# Patient Record
Sex: Female | Born: 1955
Health system: Southern US, Community
[De-identification: ages and names within clinical notes are randomized; demographics above are authoritative.]

## PROBLEM LIST (undated history)

## (undated) DIAGNOSIS — E785 Hyperlipidemia, unspecified: Secondary | ICD-10-CM

## (undated) DIAGNOSIS — M1990 Unspecified osteoarthritis, unspecified site: Secondary | ICD-10-CM

## (undated) DIAGNOSIS — M7989 Other specified soft tissue disorders: Secondary | ICD-10-CM

## (undated) HISTORY — PX: ABDOMINAL HYSTERECTOMY: SHX81

## (undated) HISTORY — DX: Unspecified osteoarthritis, unspecified site: M19.90

## (undated) HISTORY — DX: Other specified soft tissue disorders: M79.89

## (undated) HISTORY — DX: Hyperlipidemia, unspecified: E78.5

---

## 2014-07-11 ENCOUNTER — Other Ambulatory Visit: Payer: Self-pay | Admitting: Family Medicine

## 2014-07-11 DIAGNOSIS — Z1231 Encounter for screening mammogram for malignant neoplasm of breast: Secondary | ICD-10-CM

## 2014-08-04 ENCOUNTER — Ambulatory Visit: Payer: Self-pay

## 2014-08-14 ENCOUNTER — Ambulatory Visit
Admission: RE | Admit: 2014-08-14 | Discharge: 2014-08-14 | Disposition: A | Discharge status: Home / Self Care | Payer: BC Managed Care – PPO | Source: Ambulatory Visit | Attending: Family Medicine | Admitting: Family Medicine

## 2014-08-14 DIAGNOSIS — Z1231 Encounter for screening mammogram for malignant neoplasm of breast: Secondary | ICD-10-CM

## 2015-07-13 ENCOUNTER — Other Ambulatory Visit: Payer: Self-pay

## 2015-07-13 DIAGNOSIS — Z1231 Encounter for screening mammogram for malignant neoplasm of breast: Secondary | ICD-10-CM

## 2015-08-04 ENCOUNTER — Other Ambulatory Visit: Payer: Self-pay | Admitting: Family Medicine

## 2015-08-04 DIAGNOSIS — Z139 Encounter for screening, unspecified: Secondary | ICD-10-CM

## 2015-08-20 ENCOUNTER — Ambulatory Visit
Admission: RE | Admit: 2015-08-20 | Discharge: 2015-08-20 | Disposition: A | Discharge status: Home / Self Care | Payer: BC Managed Care – PPO | Source: Ambulatory Visit

## 2015-08-20 DIAGNOSIS — Z1231 Encounter for screening mammogram for malignant neoplasm of breast: Secondary | ICD-10-CM

## 2015-09-14 ENCOUNTER — Ambulatory Visit
Admission: RE | Admit: 2015-09-14 | Discharge: 2015-09-14 | Disposition: A | Discharge status: Home / Self Care | Payer: BC Managed Care – PPO | Source: Ambulatory Visit | Attending: Family Medicine | Admitting: Family Medicine

## 2015-09-14 DIAGNOSIS — Z139 Encounter for screening, unspecified: Secondary | ICD-10-CM

## 2015-11-24 ENCOUNTER — Other Ambulatory Visit: Payer: Self-pay | Admitting: Family Medicine

## 2015-11-24 DIAGNOSIS — IMO0002 Reserved for concepts with insufficient information to code with codable children: Secondary | ICD-10-CM

## 2015-11-24 DIAGNOSIS — R229 Localized swelling, mass and lump, unspecified: Principal | ICD-10-CM

## 2015-11-26 ENCOUNTER — Ambulatory Visit
Admission: RE | Admit: 2015-11-26 | Discharge: 2015-11-26 | Disposition: A | Discharge status: Home / Self Care | Payer: BC Managed Care – PPO | Source: Ambulatory Visit | Attending: Family Medicine | Admitting: Family Medicine

## 2015-11-26 ENCOUNTER — Other Ambulatory Visit: Payer: BC Managed Care – PPO

## 2015-11-26 DIAGNOSIS — R229 Localized swelling, mass and lump, unspecified: Principal | ICD-10-CM

## 2015-11-26 DIAGNOSIS — IMO0002 Reserved for concepts with insufficient information to code with codable children: Secondary | ICD-10-CM

## 2015-12-30 ENCOUNTER — Other Ambulatory Visit: Payer: Self-pay | Admitting: Family Medicine

## 2015-12-30 ENCOUNTER — Ambulatory Visit
Admission: RE | Admit: 2015-12-30 | Discharge: 2015-12-30 | Disposition: A | Discharge status: Home / Self Care | Payer: BC Managed Care – PPO | Source: Ambulatory Visit | Attending: Family Medicine | Admitting: Family Medicine

## 2015-12-30 DIAGNOSIS — R079 Chest pain, unspecified: Secondary | ICD-10-CM

## 2015-12-30 DIAGNOSIS — R0602 Shortness of breath: Secondary | ICD-10-CM

## 2016-01-06 ENCOUNTER — Other Ambulatory Visit: Payer: Self-pay | Admitting: Family Medicine

## 2016-01-06 DIAGNOSIS — R200 Anesthesia of skin: Secondary | ICD-10-CM

## 2016-01-06 DIAGNOSIS — R202 Paresthesia of skin: Principal | ICD-10-CM

## 2016-01-19 ENCOUNTER — Ambulatory Visit
Admission: RE | Admit: 2016-01-19 | Discharge: 2016-01-19 | Disposition: A | Discharge status: Home / Self Care | Payer: BC Managed Care – PPO | Source: Ambulatory Visit | Attending: Family Medicine | Admitting: Family Medicine

## 2016-01-19 DIAGNOSIS — R202 Paresthesia of skin: Principal | ICD-10-CM

## 2016-01-19 DIAGNOSIS — R200 Anesthesia of skin: Secondary | ICD-10-CM

## 2016-01-19 MED ORDER — GADOBENATE DIMEGLUMINE 529 MG/ML IV SOLN
15.0000 mL | Freq: Once | INTRAVENOUS | Status: AC | PRN
  Administered 2016-01-19: 15 mL via INTRAVENOUS

## 2016-07-04 ENCOUNTER — Other Ambulatory Visit: Payer: Self-pay | Admitting: Internal Medicine

## 2016-07-04 DIAGNOSIS — I82621 Acute embolism and thrombosis of deep veins of right upper extremity: Secondary | ICD-10-CM

## 2016-07-05 ENCOUNTER — Ambulatory Visit
Admission: RE | Admit: 2016-07-05 | Discharge: 2016-07-05 | Disposition: A | Discharge status: Home / Self Care | Payer: BC Managed Care – PPO | Source: Ambulatory Visit | Attending: Internal Medicine | Admitting: Internal Medicine

## 2016-07-05 DIAGNOSIS — I82621 Acute embolism and thrombosis of deep veins of right upper extremity: Secondary | ICD-10-CM

## 2016-07-06 ENCOUNTER — Other Ambulatory Visit: Payer: Self-pay | Admitting: Internal Medicine

## 2016-07-06 DIAGNOSIS — N6489 Other specified disorders of breast: Secondary | ICD-10-CM

## 2016-07-13 ENCOUNTER — Ambulatory Visit
Admission: RE | Admit: 2016-07-13 | Discharge: 2016-07-13 | Disposition: A | Discharge status: Home / Self Care | Payer: BC Managed Care – PPO | Source: Ambulatory Visit | Attending: Internal Medicine | Admitting: Internal Medicine

## 2016-07-13 DIAGNOSIS — N6489 Other specified disorders of breast: Secondary | ICD-10-CM

## 2016-07-15 ENCOUNTER — Other Ambulatory Visit: Payer: Self-pay | Admitting: Internal Medicine

## 2016-07-15 DIAGNOSIS — Z1231 Encounter for screening mammogram for malignant neoplasm of breast: Secondary | ICD-10-CM

## 2016-09-05 ENCOUNTER — Ambulatory Visit
Admission: RE | Admit: 2016-09-05 | Discharge: 2016-09-05 | Disposition: A | Discharge status: Home / Self Care | Payer: BC Managed Care – PPO | Source: Ambulatory Visit | Attending: Internal Medicine | Admitting: Internal Medicine

## 2016-09-05 DIAGNOSIS — Z1231 Encounter for screening mammogram for malignant neoplasm of breast: Secondary | ICD-10-CM

## 2017-03-20 ENCOUNTER — Other Ambulatory Visit: Payer: Self-pay | Admitting: Gastroenterology

## 2017-03-20 DIAGNOSIS — R1032 Left lower quadrant pain: Secondary | ICD-10-CM

## 2017-03-24 ENCOUNTER — Ambulatory Visit
Admission: RE | Admit: 2017-03-24 | Discharge: 2017-03-24 | Disposition: A | Discharge status: Home / Self Care | Payer: BC Managed Care – PPO | Source: Ambulatory Visit | Attending: Gastroenterology | Admitting: Gastroenterology

## 2017-03-24 DIAGNOSIS — R1032 Left lower quadrant pain: Secondary | ICD-10-CM

## 2017-03-24 MED ORDER — IOPAMIDOL (ISOVUE-300) INJECTION 61%
100.0000 mL | Freq: Once | INTRAVENOUS | Status: AC | PRN
  Administered 2017-03-24: 100 mL via INTRAVENOUS

## 2017-04-28 ENCOUNTER — Other Ambulatory Visit: Payer: Self-pay | Admitting: Obstetrics and Gynecology

## 2017-04-28 DIAGNOSIS — N63 Unspecified lump in unspecified breast: Secondary | ICD-10-CM

## 2017-05-02 ENCOUNTER — Other Ambulatory Visit: Payer: BC Managed Care – PPO

## 2017-05-12 ENCOUNTER — Ambulatory Visit
Admission: RE | Admit: 2017-05-12 | Discharge: 2017-05-12 | Disposition: A | Discharge status: Home / Self Care | Payer: BC Managed Care – PPO | Source: Ambulatory Visit | Attending: Obstetrics and Gynecology | Admitting: Obstetrics and Gynecology

## 2017-05-12 DIAGNOSIS — N63 Unspecified lump in unspecified breast: Secondary | ICD-10-CM

## 2017-07-31 ENCOUNTER — Other Ambulatory Visit: Payer: Self-pay | Admitting: Internal Medicine

## 2017-07-31 DIAGNOSIS — Z1239 Encounter for other screening for malignant neoplasm of breast: Secondary | ICD-10-CM

## 2017-09-18 ENCOUNTER — Ambulatory Visit
Admission: RE | Admit: 2017-09-18 | Discharge: 2017-09-18 | Disposition: A | Discharge status: Home / Self Care | Payer: BC Managed Care – PPO | Source: Ambulatory Visit | Attending: Internal Medicine | Admitting: Internal Medicine

## 2017-09-18 DIAGNOSIS — Z1239 Encounter for other screening for malignant neoplasm of breast: Secondary | ICD-10-CM

## 2018-02-01 ENCOUNTER — Other Ambulatory Visit: Payer: Self-pay

## 2018-02-01 DIAGNOSIS — M7989 Other specified soft tissue disorders: Secondary | ICD-10-CM

## 2018-03-16 ENCOUNTER — Ambulatory Visit (INDEPENDENT_AMBULATORY_CARE_PROVIDER_SITE_OTHER): Payer: BC Managed Care – PPO | Admitting: Vascular Surgery

## 2018-03-16 ENCOUNTER — Other Ambulatory Visit: Payer: Self-pay

## 2018-03-16 ENCOUNTER — Encounter: Payer: Self-pay | Admitting: Vascular Surgery

## 2018-03-16 ENCOUNTER — Ambulatory Visit (HOSPITAL_COMMUNITY)
Admission: RE | Admit: 2018-03-16 | Discharge: 2018-03-16 | Disposition: A | Discharge status: Home / Self Care | Payer: BC Managed Care – PPO | Source: Ambulatory Visit | Attending: Vascular Surgery | Admitting: Vascular Surgery

## 2018-03-16 VITALS — BP 126/84 | HR 73 | Temp 97.2°F | Resp 16 | Ht <= 58 in | Wt 163.0 lb

## 2018-03-16 DIAGNOSIS — M7989 Other specified soft tissue disorders: Secondary | ICD-10-CM

## 2018-03-16 NOTE — Progress Notes (Signed)
Patient ID: Lori Mata, female   DOB: 05-20-1956, 62 y.o.   MRN: [id number]  Reason for Consult: New Patient (Initial Visit) (swelling of right arm)   Referred by Zelphia Cairo, MD  Subjective:     HPI:  Lori Mata is a 62 y.o. female with out significant history.  She does have a recent history of swelling in her right upper extremity which is intermittent.  She states that the swelling is mostly mild but occasionally can be moderate.  She feels like her arm sits differently along the side of her chest and also her sleeves for differently.  She has never had a personal or family history of a blood clot.  She has been tested for breast cancer does not have any previous injury or right upper extremity chest or breast surgery.  She does not have any bulging veins.  She does not have any swelling in her face or neck.  She has no difficulty breathing when lying flat.  Currently her arm feels only very minimally swollen.  Past Medical History:  Diagnosis Date  . Swelling in right armpit    History reviewed. No pertinent family history. Past Surgical History:  Procedure Laterality Date  . ABDOMINAL HYSTERECTOMY      Short Social History:  Social History   Tobacco Use  . Smoking status: Never Smoker  . Smokeless tobacco: Never Used  Substance Use Topics  . Alcohol use: Yes    No Known Allergies  Current Outpatient Medications  Medication Sig Dispense Refill  . SPIRONOLACTONE PO Take by mouth.     No current facility-administered medications for this visit.     Review of Systems  Constitutional:  Constitutional negative. HENT: HENT negative.  Eyes: Eyes negative.  Respiratory: Respiratory negative.  Cardiovascular: Cardiovascular negative.  GI: Gastrointestinal negative.  Musculoskeletal:       Right arm edema Skin: Skin negative.  Neurological: Neurological negative. Hematologic: Hematologic/lymphatic negative.  Psychiatric: Psychiatric negative.        Objective:   Objective   Vitals:   03/16/18 0843  BP: 126/84  Pulse: 73  Resp: 16  Temp: (!) 97.2 F (36.2 C)  TempSrc: Oral  SpO2: 100%  Weight: 163 lb (73.9 kg)  Height: 4' 9.5" (1.461 m)   Body mass index is 34.66 kg/m.  Physical Exam  Constitutional: She is oriented to person, place, and time. She appears well-developed.  HENT:  Head: Normocephalic and atraumatic.  Eyes: Pupils are equal, round, and reactive to light.  Neck: Normal range of motion. Neck supple.  Cardiovascular: Normal rate.  Pulses:      Carotid pulses are 2+ on the right side, and 2+ on the left side.      Radial pulses are 2+ on the right side, and 2+ on the left side.  Pulmonary/Chest: Effort normal.  Abdominal: Soft.  Musculoskeletal:  Minimally larger rue than left  Lymphadenopathy:    She has no cervical adenopathy.  Neurological: She is alert and oriented to person, place, and time.  Skin: Skin is warm and dry.  Psychiatric: She has a normal mood and affect. Her behavior is normal. Judgment and thought content normal.    Data: I have independently interpreted her upper extremity venous study today which demonstrates no evidence of obstruction in her deep or superficial veins.     Assessment/Plan:     62 year old female presents with right upper extremity as well as chest and breast swelling that is intermittent in nature and usually  confined to mild and at worst moderate in nature.  There is no evidence of venous obstruction on our ultrasound today and then previous MRI did not demonstrate any nerve root compression to suggest any symptoms either.  I have offered her that we could pursue further studies with CT venogram or upper extremity venography or we could continue with expectant management.  At this time she wants no intervention and she can follow-up on a as needed basis.     Maeola HarmanBrandon Christopher Zylie Mumaw MD [facility]

## 2018-08-10 ENCOUNTER — Other Ambulatory Visit: Payer: Self-pay | Admitting: Internal Medicine

## 2018-08-10 DIAGNOSIS — Z1231 Encounter for screening mammogram for malignant neoplasm of breast: Secondary | ICD-10-CM

## 2018-09-25 ENCOUNTER — Ambulatory Visit
Admission: RE | Admit: 2018-09-25 | Discharge: 2018-09-25 | Disposition: A | Discharge status: Home / Self Care | Payer: BC Managed Care – PPO | Source: Ambulatory Visit | Attending: Internal Medicine | Admitting: Internal Medicine

## 2018-09-25 DIAGNOSIS — Z1231 Encounter for screening mammogram for malignant neoplasm of breast: Secondary | ICD-10-CM

## 2018-11-08 ENCOUNTER — Other Ambulatory Visit: Payer: Self-pay | Admitting: Radiology

## 2018-11-08 DIAGNOSIS — N644 Mastodynia: Secondary | ICD-10-CM

## 2018-11-09 ENCOUNTER — Other Ambulatory Visit: Payer: Self-pay | Admitting: Obstetrics and Gynecology

## 2018-11-09 DIAGNOSIS — N644 Mastodynia: Secondary | ICD-10-CM

## 2018-11-14 ENCOUNTER — Ambulatory Visit: Payer: BC Managed Care – PPO

## 2018-11-14 ENCOUNTER — Ambulatory Visit
Admission: RE | Admit: 2018-11-14 | Discharge: 2018-11-14 | Disposition: A | Discharge status: Home / Self Care | Payer: BC Managed Care – PPO | Source: Ambulatory Visit | Attending: Radiology | Admitting: Radiology

## 2018-11-14 ENCOUNTER — Other Ambulatory Visit: Payer: Self-pay | Admitting: Obstetrics and Gynecology

## 2018-11-14 ENCOUNTER — Ambulatory Visit
Admission: RE | Admit: 2018-11-14 | Discharge: 2018-11-14 | Disposition: A | Discharge status: Home / Self Care | Payer: BC Managed Care – PPO | Source: Ambulatory Visit | Attending: Obstetrics and Gynecology | Admitting: Obstetrics and Gynecology

## 2018-11-14 DIAGNOSIS — N644 Mastodynia: Secondary | ICD-10-CM

## 2019-08-15 ENCOUNTER — Other Ambulatory Visit: Payer: Self-pay | Admitting: Internal Medicine

## 2019-08-15 ENCOUNTER — Other Ambulatory Visit: Payer: Self-pay | Admitting: Obstetrics and Gynecology

## 2019-08-15 DIAGNOSIS — Z1231 Encounter for screening mammogram for malignant neoplasm of breast: Secondary | ICD-10-CM

## 2019-08-27 ENCOUNTER — Ambulatory Visit: Payer: Self-pay

## 2019-08-27 ENCOUNTER — Encounter: Payer: Self-pay | Admitting: Sports Medicine

## 2019-08-27 ENCOUNTER — Ambulatory Visit (INDEPENDENT_AMBULATORY_CARE_PROVIDER_SITE_OTHER): Payer: BC Managed Care – PPO | Admitting: Sports Medicine

## 2019-08-27 ENCOUNTER — Other Ambulatory Visit: Payer: Self-pay

## 2019-08-27 VITALS — BP 118/82 | Ht 67.0 in | Wt 157.0 lb

## 2019-08-27 DIAGNOSIS — S86002A Unspecified injury of left Achilles tendon, initial encounter: Secondary | ICD-10-CM

## 2019-08-27 NOTE — Progress Notes (Signed)
Subjective:    Patient ID: Lori Mata, female    DOB: [birth year], 63 y.o.   MRN: [id number]  HPI 63 year old female who presents with left posterior leg pain.  The pain started on 08/22/2019.  She states that she was in the middle of a 6 mile walk when the pain started.  It was of gradual onset and was a dull, aching pain in her posterior leg.  The patient was able to finish her walk, and did not think too much about it until she woke the next morning to a sharp aching sensation that had increased in intensity.  She took some Advil which helped her pain a little bit.  She stated that she had pain at both rest and with activity at that time.  She has been resting her leg as much as possible.  The pain has markedly improved since then but is still giving her some trouble when she walks or goes up and down stairs.  The patient never had this pain before.  She did not notice any trauma at the time of her pain starting.  She has noticed a "lump" and some swelling in her posterior Achilles tendon.  Of note the patient markedly increased her walking distance and duration approximately 2 months ago.  She had been walking 3 miles 5 times per week and is now walking 6 miles 7 times per week. She has not changed her shoes over this time course. She wears 0 drop shoes.   Review of Systems Per HPI    Objective:   Physical Exam General: Very pleasant 63 year old Caucasian female, no acute distress Respiratory: No distress, no sensory muscle use, able speak in clear coherent sentences Cardio: Skin warm and dry.  Left foot/ankle Inspection: No notable inversion or eversion with standing.  Negative "too many toes" sign.  Minimal swelling and mild fullness of left Achilles as compared to right Achilles area. Palpation: Tenderness to palpation left Achilles approximately 1 to 2 cm proximal to insertion site.  Increased tenderness to palpation anterior Achilles tendon.  Swelling noted by palpation left Achilles  tendon Range of motion: Fully intact, no limitations Strength: 5/5 strength to foot eversion, inversion.  5/5 strength to plantar and dorsiflexion of foot Stability: No signs of instability noted Neurovascular: Sensation fully intact, skin warm and dry. Special test: Negative Thompson test  Achilles ultrasound: Findings: Mild fluid accumulation along course of tendon corresponding to point of maximal pain observed in long axis.  Hypoechoic area noted within tendon on short axis, consistent with tendinopathy.  Patient with increased echogenicity of tendon sheath, and increased vascularity observed with Doppler.  Retrocalcaneal bursa visualized with no hypoechoic areas seen.  Achilles tendon attachment site with small osteophyte observed but otherwise no abnormal appearing structures findings.  Impression: Findings consistent with Achilles tendinopathy.  Small osteophyte noted at Achilles attachment site, unlikely to be clinically relevant.  Negative scan for retrocalcaneal bursitis.      Assessment & Plan:  Assessment 63 year old female who presents with approximately 5-day history of Achilles tendon pain.  Given her history, exam, and ultrasound findings patient likely has Achilles tendinopathy.  Retrocalcaneal bursitis considered but felt to be less likely given ultrasound findings.  Will treat with anti-inflammatories, eccentric soleus and gastrocnemius strengthening exercises, and gradual return to activity.  Also advised patient to go pick up different shoes with a heel lift.  Gave patient 5/16" heel lifts.  Patient can follow-up PRN if symptoms do not resolve or if they get  worse.  Plan - aleve 500mg  bid for 4 days - change walking shoes to shoes with better heel lift, add 5/16" heel pads - in 1-2 weeks start eccentric strengthening exercises for gastrocnemius and soleus - gradual return to activity, recommended starting at 3 mile walks and increasing by 10% each week - follow up prn   [name] MD PGY-3 Family Medicine Resident  Patient seen and evaluated with the resident.  I agree with the above plan of care.  Patient's ultrasound shows some inflammation around the Achilles tendon but dominant finding is Achilles tendinopathy.  Patient is clinically improving.  Treatment as above.  If symptoms persist consider topical nitroglycerin or formal physical therapy.  Follow-up for ongoing or recalcitrant issues.

## 2019-08-27 NOTE — Patient Instructions (Addendum)
It was great meeting you today! I am sorry that you have been having trouble with your achilles tendon. We performed an ultrasound and you have an achilles tendinopathy.  We will treat this in a couple of ways. You can take aleve 500mg  two times a day for the next 3-4 days.  We recommend getting a new shoe. Any normal walking shoe with the achilles elevated will work much better. We also gave you 5/16" heel lifts to further offload this area.   We gave you achilles eccentric muscle strengthening exercises. We discussed the ways that you can incorporate your piliates equipment into this. You can do these when the pain has improved a little bit.  Lastly you should gradually increase your walking. When you are feeling a little better you can start off with your old walking regimen (3 miles 5 times per week). You can gradually increase this by about 10% each week.

## 2019-09-26 ENCOUNTER — Ambulatory Visit (INDEPENDENT_AMBULATORY_CARE_PROVIDER_SITE_OTHER): Payer: BC Managed Care – PPO | Admitting: Sports Medicine

## 2019-09-26 ENCOUNTER — Other Ambulatory Visit: Payer: Self-pay

## 2019-09-26 VITALS — BP 110/70 | Ht 67.0 in | Wt 153.0 lb

## 2019-09-26 DIAGNOSIS — S86002D Unspecified injury of left Achilles tendon, subsequent encounter: Secondary | ICD-10-CM | POA: Insufficient documentation

## 2019-09-26 NOTE — Progress Notes (Signed)
phys

## 2019-09-26 NOTE — Assessment & Plan Note (Addendum)
History, exam, and [place] on 9/9 consistent with left achilles tendinopathy.  No improvement with home exercises.  Will refer for formal PT.  Advised to decrease activity to walking at 50% and increase gradually by 10% weekly if pain improving.  F/U in 6 weeks or sooner as needed.

## 2019-09-26 NOTE — Progress Notes (Signed)
Lori Mata is a 63 y.o. female who presents to [facility] today for the following:  F/U L achilles tendinopathy Seen 9/8 and had [place], diagnosed with left achilles tendinopathy Used ibuprofen for few days, but did not use longer as causes GI upset Did not have improvement with this Has been doing eccentric exercises since 5 days after last appointment religiously BID without improvement Notes that she continues to have discomfort in the area, but does not endorse "true pain" Walking 2, sometimes 3 miles, daily and has pain after this.  Will ice afterwards. Pain worse in AM and improves with normal movement throughout the day into the afternoon. Pain comes and goes and occasionally feels a burning Got new shoes, as she had been using 0 drop running shoes before, also using heel lifts and thinks that greatly helps her pain States that achilles has stayed swollen on the left    PMH reviewed.  ROS as above. Medications reviewed.  Exam:  BP 110/70   Ht 5\' 7"  (1.702 m)   Wt 153 lb (69.4 kg)   BMI 23.96 kg/m  Gen: Well NAD MSK:  Left Ankle: - Inspection: Fusiform swelling of left achilles with decreased calf size on left compared to right.  Left calf measures 35cm, right measures 36 cm.  No obvious deformity, erythema, or ecchymosis, ulcers, calluses, blisters - Palpation: No TTP at MT heads, no TTP at base of 5th MT, no TTP over cuboid, no tenderness over navicular prominence, no TTP over lateral or medial malleolus.  TTP medial achilles, most significant in mid tendon. - Strength: Normal strength with dorsiflexion, plantarflexion, inversion, and eversion of foot; flexion and extension of toes - ROM: Full ROM - Neuro/vasc: NV intact - Special Tests: Negative anterior drawer, negative talar tilt.    Feet: no deformity noted.  Normal arch w/I pes cavus or planus, normal arch flexibility.  Normal calcaneal motion with toe-raise. Does note mild discomfort with heel raise.    [place] Limited Joint  Space Structures Low Left  Result Date: 08/28/2019 Limited ultrasound of the left Achilles was performed.  Images in both long and short axis were obtained.  There is Achilles thickening with hypoechoic changes within the substance of the tendon.  Small amount of fluid along the posterior most fibers of the Achilles tendon as well.  No signs of tendon rupture.  No signs of retrocalcaneal bursitis.  Findings are consistent with Achilles tendinopathy with mild inflammation.    Assessment and Plan: 1) Achilles tendon injury, left, subsequent encounter History, exam, and [place] on 9/9 consistent with left achilles tendinopathy.  No improvement with home exercises.  Will refer for formal PT.  Advised to decrease activity to walking at 50% and increase gradually by 10% weekly if pain improving.  F/U in 6 weeks or sooner as needed.   [name], D.O.  PGY-2 Family Medicine  09/26/2019 11:07 AM   Patient seen and evaluated with the resident.  I agree with the above plan of care.  I have asked the patient to decrease her recreational walking by 50 %/week and increase 10 %/week thereafter based on symptoms.  I would also like for her to start formal physical therapy and follow-up with me again in 6 weeks.  Call with questions or concerns in the interim.

## 2019-09-30 ENCOUNTER — Ambulatory Visit
Admission: RE | Admit: 2019-09-30 | Discharge: 2019-09-30 | Disposition: A | Discharge status: Home / Self Care | Payer: BC Managed Care – PPO | Source: Ambulatory Visit | Attending: Obstetrics and Gynecology | Admitting: Obstetrics and Gynecology

## 2019-09-30 ENCOUNTER — Other Ambulatory Visit: Payer: Self-pay

## 2019-09-30 DIAGNOSIS — Z1231 Encounter for screening mammogram for malignant neoplasm of breast: Secondary | ICD-10-CM

## 2019-10-02 ENCOUNTER — Ambulatory Visit: Payer: BC Managed Care – PPO | Attending: Sports Medicine | Admitting: Physical Therapy

## 2019-10-02 ENCOUNTER — Other Ambulatory Visit: Payer: Self-pay

## 2019-10-02 DIAGNOSIS — M25572 Pain in left ankle and joints of left foot: Secondary | ICD-10-CM | POA: Diagnosis present

## 2019-10-04 ENCOUNTER — Encounter: Payer: Self-pay | Admitting: Physical Therapy

## 2019-10-04 NOTE — Therapy (Signed)
[place], [place], [address] Phone: [phone]   Fax:  [phone]  Physical Therapy Evaluation  Patient Details  Name: Lori Mata MRN: [id number] Date of Birth: [birth year] Referring Provider (PT): Lilia Argue   Encounter Date: 10/02/2019  PT End of Session - 10/04/19 1504    Visit Number  1    Number of Visits  12    Date for PT Re-Evaluation  11/13/19    Authorization Type  [employer]    PT Start Time  0254    PT Stop Time  1615    PT Time Calculation (min)  37 min    Activity Tolerance  Patient tolerated treatment well    Behavior During Therapy  [facility] for tasks assessed/performed       Past Medical History:  Diagnosis Date  . Swelling in right armpit     Past Surgical History:  Procedure Laterality Date  . ABDOMINAL HYSTERECTOMY      There were no vitals filed for this visit.   Subjective Assessment - 10/04/19 1445    Subjective  Pt states increased pain in L achilles/heel. She was walking on average 2-3 miles/day but then increased to up to 6-8 miles/day, and had switches to zero drop shoe at the time. She has not had foot/ankle pain in the past. . She is working from home, professor, Veterinary surgeon. She is now wearing Adrenaline shoe with superfeet inserts, will bring to next visit. Pt states mild improvement in pain since wearing shoes, but is still sore. Eager to get back to more walking.    Limitations  Standing;Walking;House hold activities    Patient Stated Goals  Decreased pain, increased walking milage    Currently in Pain?  Yes    Pain Score  5     Pain Location  Ankle    Pain Orientation  Left    Pain Descriptors / Indicators  Aching    Pain Type  Acute pain    Pain Onset  1 to 4 weeks ago    Pain Frequency  Intermittent    Aggravating Factors   standing, walking, in AM    Pain Relieving Factors  rest,         [facility] PT Assessment - 10/04/19 0001      Assessment   Medical  Diagnosis  L Achilles Injury    Referring Provider (PT)  Lilia Argue    Prior Therapy  no      Balance Screen   Has the patient fallen in the past 6 months  No      Prior Function   Level of Independence  Independent      Cognition   Overall Cognitive Status  Within Functional Limits for tasks assessed      Posture/Postural Control   Posture Comments  Standing: Neutral foot posture bilaterally       ROM / Strength   AROM / PROM / Strength  AROM;Strength      AROM   Overall AROM Comments  L ankle PF: WFL, DF: 3, INV/EV: WFL;       Strength   Overall Strength Comments  Ankle PF/DF: 4/5;  Inv/Ev: 4/5 ;  Hips: 4+/5, Knee: 4+/5       Palpation   Palpation comment  Hypomobile STJ, Pain at distal achilles, Thickening of tendon distally, Trigger points noted in gastroc      Balance   Balance Assessed  --   Decreased SLS time on  L vs R                Objective measurements completed on examination: See above findings.      [facility] Adult PT Treatment/Exercise - 10/04/19 0001      Exercises   Exercises  Ankle      Modalities   Modalities  Iontophoresis      Iontophoresis   Type of Iontophoresis  Dexamethasone    Location  L distal achilles    Time  6 hr patch      Manual Therapy   Manual Therapy  Joint mobilization;Passive ROM;Soft tissue mobilization    Joint Mobilization  To inc DF    Soft tissue mobilization  DTM and IASTM to achilles.     Passive ROM  to inc DF       Ankle Exercises: Stretches   Other Stretch  Prone: AROM for DF/PF              PT Education - 10/04/19 1504    Education Details  PT POC, HEP, footwear    Person(s) Educated  Patient    Methods  Explanation;Demonstration;Handout    Comprehension  Verbalized understanding;Returned demonstration;Need further instruction       PT Short Term Goals - 10/04/19 1507      PT SHORT TERM GOAL #1   Title  Pt to be independent wtih initial HEP    Time  2    Period  Weeks    Status   New    Target Date  10/16/19      PT SHORT TERM GOAL #2   Title  Pt to report decreased pain in L foot to be 3/10 with activity    Time  2    Status  New    Target Date  10/16/19        PT Long Term Goals - 10/04/19 1508      PT LONG TERM GOAL #1   Title  Pt to be compliant with footwear appropriate for her foot type and Dx.    Time  6    Period  Weeks    Status  New    Target Date  11/13/19      PT LONG TERM GOAL #2   Title  Pt to report decreased pain in L foot, to 0-2/10 with walking up to 3 miles    Time  6    Period  Weeks    Status  New    Target Date  11/13/19      PT LONG TERM GOAL #3   Title  Pt to be independent with final HEP    Time  6    Period  Weeks    Status  New    Target Date  11/13/19             Plan - 10/04/19 1512    Clinical Impression Statement  Pt presents wtih primary complaint of increased pain in L distal achilles. She has soreness at mid and R achilles, at insertion, with thickening of tendon compared to R side. Pt with stiffness of STJ, with mild decrease in DF ROM. Pt with decreased ability for full DF at terminal stance of gait. Pt with likely irritation from increased walking milage in low heel shoes.Discussed footwear with pt today, she will bring shoes in next visit to assess. Pt with decrased ability for full funcitonal activities andcommunity activites, due to pain with weight bearing and walking. Pt to  benefit from skilled PT to improve.    Examination-Activity Limitations  Locomotion Level;Stand;Stairs    Examination-Participation Restrictions  Other;Community Activity    Stability/Clinical Decision Making  Stable/Uncomplicated    Clinical Decision Making  Low    Rehab Potential  Good    PT Frequency  2x / week    PT Duration  6 weeks    PT Treatment/Interventions  ADLs/Self Care Home Management;Cryotherapy;Electrical Stimulation;DME Instruction;Ultrasound;Moist Heat;Iontophoresis 4mg /ml Dexamethasone;Gait training;Stair  training;Functional mobility training;Therapeutic activities;Therapeutic exercise;Balance training;Orthotic Fit/Training;Patient/family education;Neuromuscular re-education;Manual techniques;Taping;Dry needling;Passive range of motion;Spinal Manipulations;Joint Manipulations    PT Next Visit Plan  Review DF mob and stretch    PT Home Exercise Plan  [employer]    Consulted and Agree with Plan of Care  Patient       Patient will benefit from skilled therapeutic intervention in order to improve the following deficits and impairments:  Abnormal gait, Increased fascial restricitons, Pain, Increased muscle spasms, Decreased mobility, Decreased activity tolerance, Decreased range of motion, Decreased strength, Difficulty walking  Visit Diagnosis: Pain in left ankle and joints of left foot     Problem List Patient Active Problem List   Diagnosis Date Noted  . Achilles tendon injury, left, subsequent encounter 09/26/2019    Sedalia MutaLauren Yohan Samons, PT, DPT 3:25 PM  10/04/19    [facility] [address] [place], [place], [address] Phone: [phone]   Fax:  [phone]  Name: Lucile CraterDana Guadamuz MRN: [id number] Date of Birth: 12/13/1956

## 2019-10-04 NOTE — Patient Instructions (Signed)
Continue with [employer] she is currently doing, 3x15, 2-3 x/day

## 2019-10-16 ENCOUNTER — Other Ambulatory Visit: Payer: Self-pay

## 2019-10-16 ENCOUNTER — Ambulatory Visit: Payer: BC Managed Care – PPO | Admitting: Physical Therapy

## 2019-10-16 ENCOUNTER — Encounter: Payer: Self-pay | Admitting: Physical Therapy

## 2019-10-16 DIAGNOSIS — M25572 Pain in left ankle and joints of left foot: Secondary | ICD-10-CM | POA: Diagnosis not present

## 2019-10-16 NOTE — Therapy (Signed)
[place], [place], [address] Phone: [phone]   Fax:  [phone]  Physical Therapy Treatment  Patient Details  Name: Lori Mata MRN: [id number] Date of Birth: 1956-11-22 Referring Provider (PT): Lilia Argue   Encounter Date: 10/16/2019  PT End of Session - 10/16/19 1514    Visit Number  2    Number of Visits  12    Date for PT Re-Evaluation  11/13/19    Authorization Type  BCBS    PT Start Time  1315    PT Stop Time  1354    PT Time Calculation (min)  39 min    Activity Tolerance  Patient tolerated treatment well    Behavior During Therapy  [facility] for tasks assessed/performed       Past Medical History:  Diagnosis Date  . Swelling in right armpit     Past Surgical History:  Procedure Laterality Date  . ABDOMINAL HYSTERECTOMY      There were no vitals filed for this visit.  Subjective Assessment - 10/16/19 1414    Subjective  Pt states slight improvements. Brought sneakers to look at today.    Currently in Pain?  Yes    Pain Score  5     Pain Location  Ankle    Pain Orientation  Left    Pain Descriptors / Indicators  Aching    Pain Type  Acute pain    Pain Onset  1 to 4 weeks ago    Pain Frequency  Intermittent                       [facility] Adult PT Treatment/Exercise - 10/16/19 1408      Posture/Postural Control   Posture Comments  Standing: Neutral foot posture bilaterally       Exercises   Exercises  Ankle      Modalities   Modalities  Iontophoresis      Iontophoresis   Type of Iontophoresis  Dexamethasone    Location  L distal achilles    Time  6 hr patch      Manual Therapy   Manual Therapy  Joint mobilization;Passive ROM;Soft tissue mobilization    Joint Mobilization  To inc DF and EV    Soft tissue mobilization  DTM and IASTM to gastroc and achilles.     Passive ROM  to inc DF       Ankle Exercises: Stretches   Other Stretch  Prone: AROM for DF/PF      Other Stretch  Supine HSS with strap and ankle pump 2x10;  Kneeling DF glide x20 ;       Ankle Exercises: Aerobic   Recumbent Bike  L4 x8 min;       Ankle Exercises: Standing   Heel Raises Limitations  x30 eccentric, with ball at ankles       Ankle Exercises: Supine   Other Supine Ankle Exercises  Inv/Ev ROM x20;              PT Education - 10/16/19 1403    Education Details  Education on HEP    Person(s) Educated  Patient    Methods  Explanation;Demonstration;Verbal cues    Comprehension  Verbalized understanding;Returned demonstration;Verbal cues required;Need further instruction       PT Short Term Goals - 10/04/19 1507      PT SHORT TERM GOAL #1   Title  Pt to be independent wtih initial HEP  Time  2    Period  Weeks    Status  New    Target Date  10/16/19      PT SHORT TERM GOAL #2   Title  Pt to report decreased pain in L foot to be 3/10 with activity    Time  2    Status  New    Target Date  10/16/19        PT Long Term Goals - 10/04/19 1508      PT LONG TERM GOAL #1   Title  Pt to be compliant with footwear appropriate for her foot type and Dx.    Time  6    Period  Weeks    Status  New    Target Date  11/13/19      PT LONG TERM GOAL #2   Title  Pt to report decreased pain in L foot, to 0-2/10 with walking up to 3 miles    Time  6    Period  Weeks    Status  New    Target Date  11/13/19      PT LONG TERM GOAL #3   Title  Pt to be independent with final HEP    Time  6    Period  Weeks    Status  New    Target Date  11/13/19            Plan - 10/16/19 1518    Clinical Impression Statement  Pt with soreness at medial distal achilles. Manual done today to improve DF ROM and tightness in gastroc. Ionto done for pain and inflamation. Pt instructed on HEP, handout given today. Discussed footwear today. Pts current shoes with orthotics appear to be over correcting and causing mild supination. Better with removal of orthotics. Pt will  keep current shoes for now, but will try on neutral shoe next time for optimal alignment.    Examination-Activity Limitations  Locomotion Level;Stand;Stairs    Examination-Participation Restrictions  Other;Community Activity    Stability/Clinical Decision Making  Stable/Uncomplicated    Rehab Potential  Good    PT Frequency  2x / week    PT Duration  6 weeks    PT Treatment/Interventions  ADLs/Self Care Home Management;Cryotherapy;Electrical Stimulation;DME Instruction;Ultrasound;Moist Heat;Iontophoresis 4mg /ml Dexamethasone;Gait training;Stair training;Functional mobility training;Therapeutic activities;Therapeutic exercise;Balance training;Orthotic Fit/Training;Patient/family education;Neuromuscular re-education;Manual techniques;Taping;Dry needling;Passive range of motion;Spinal Manipulations;Joint Manipulations    PT Next Visit Plan  Review DF mob and stretch    PT Home Exercise Plan  [employer]    Consulted and Agree with Plan of Care  Patient       Patient will benefit from skilled therapeutic intervention in order to improve the following deficits and impairments:  Abnormal gait, Increased fascial restricitons, Pain, Increased muscle spasms, Decreased mobility, Decreased activity tolerance, Decreased range of motion, Decreased strength, Difficulty walking  Visit Diagnosis: Pain in left ankle and joints of left foot     Problem List Patient Active Problem List   Diagnosis Date Noted  . Achilles tendon injury, left, subsequent encounter 09/26/2019   11/26/2019, PT, DPT 3:22 PM  10/16/19    [facility] [address] [place], [place], [place] Phone: [phone]   Fax:  [phone]  Name: Lori Mata MRN: [name] Date of Birth: [birth year]

## 2019-10-16 NOTE — Patient Instructions (Signed)
Access Code: [email or web address]  URL: [email or web address]  Date: 10/16/2019  Prepared by: Lyndee Hensen   Exercises Standing Heel Raise - 15 reps - 2 sets - 2-3x daily Half Kneel Ankle Dorsiflexion Self-Mobilization - 10 reps - 2 sets - 2x daily Supine Hamstring Stretch with Strap - 10 reps - 2 sets - 2x daily

## 2019-10-18 ENCOUNTER — Ambulatory Visit: Payer: BC Managed Care – PPO | Admitting: Physical Therapy

## 2019-10-23 ENCOUNTER — Encounter: Payer: Self-pay | Admitting: Physical Therapy

## 2019-10-23 ENCOUNTER — Ambulatory Visit: Payer: BC Managed Care – PPO | Attending: Sports Medicine | Admitting: Physical Therapy

## 2019-10-23 ENCOUNTER — Other Ambulatory Visit: Payer: Self-pay

## 2019-10-23 DIAGNOSIS — M25572 Pain in left ankle and joints of left foot: Secondary | ICD-10-CM | POA: Diagnosis not present

## 2019-10-23 NOTE — Therapy (Signed)
[facility] [address] [place], [place], [address] Phone: [phone]   Fax:  [phone]  Physical Therapy Treatment  Patient Details  Name: Lori Mata MRN: [id number] Date of Birth: [birth year] Referring Provider (PT): Reino Bellis   Encounter Date: 10/23/2019  PT End of Session - 10/23/19 1058    Visit Number  3    Number of Visits  12    Date for PT Re-Evaluation  11/13/19    Authorization Type  BCBS    PT Start Time  1002    PT Stop Time  1044    PT Time Calculation (min)  42 min    Activity Tolerance  Patient tolerated treatment well    Behavior During Therapy  [facility] for tasks assessed/performed       Past Medical History:  Diagnosis Date  . Swelling in right armpit     Past Surgical History:  Procedure Laterality Date  . ABDOMINAL HYSTERECTOMY      There were no vitals filed for this visit.  Subjective Assessment - 10/23/19 1056    Subjective  Pt states mild improvments, thinks pain in AM is better. She had to cancel last appt, tree fell on house, and is having to move temporarily, does not have her sneakers bc they are in debris at house.    Currently in Pain?  Yes    Pain Score  3     Pain Location  Ankle    Pain Orientation  Left    Pain Descriptors / Indicators  Aching    Pain Type  Acute pain    Pain Onset  1 to 4 weeks ago    Pain Frequency  Intermittent    Aggravating Factors   teminal stance.                       [facility] Adult PT Treatment/Exercise - 10/23/19 1001      Posture/Postural Control   Posture Comments  Standing: Neutral foot posture bilaterally       Exercises   Exercises  Ankle      Modalities   Modalities  Iontophoresis      Iontophoresis   Type of Iontophoresis  Dexamethasone    Location  L distal achilles    Time  6 hr patch      Manual Therapy   Manual Therapy  Joint mobilization;Passive ROM;Soft tissue mobilization    Joint Mobilization  To inc DF and  EV    Soft tissue mobilization  DTM and IASTM to gastroc and achilles.     Passive ROM  to inc DF       Ankle Exercises: Aerobic   Recumbent Bike  L4 x8 min;       Ankle Exercises: Stretches   Other Stretch  Prone: AROM for DF/PF     Other Stretch  Supine HSS with strap and ankle pump 2x10;  Kneeling DF glide x20 ;       Ankle Exercises: Standing   SLS  30 sec x3 bil;     Heel Raises Limitations  x30 eccentric, with ball at ankles     Other Standing Ankle Exercises  Staggered stance weight shift L R foot in front, for terminal stance push off.       Ankle Exercises: Supine   Other Supine Ankle Exercises  --               PT Short Term Goals - 10/04/19  [place] #1   Title  Pt to be independent wtih initial HEP    Time  2    Period  Weeks    Status  New    Target Date  10/16/19      PT SHORT TERM GOAL #2   Title  Pt to report decreased pain in L foot to be 3/10 with activity    Time  2    Status  New    Target Date  10/16/19        PT Long Term Goals - 10/04/19 1508      PT LONG TERM GOAL #1   Title  Pt to be compliant with footwear appropriate for her foot type and Dx.    Time  6    Period  Weeks    Status  New    Target Date  11/13/19      PT LONG TERM GOAL #2   Title  Pt to report decreased pain in L foot, to 0-2/10 with walking up to 3 miles    Time  6    Period  Weeks    Status  New    Target Date  11/13/19      PT LONG TERM GOAL #3   Title  Pt to be independent with final HEP    Time  6    Period  Weeks    Status  New    Target Date  11/13/19            Plan - 10/23/19 1101    Clinical Impression Statement  Pt with improved pain with palpation of medial achilles today. Still has mild pain at terminal stance, but overall improving gait pattern, with improved heel to toe propulsion. Pt to benefit from continued care.    Examination-Activity Limitations  Locomotion Level;Stand;Stairs    Examination-Participation  Restrictions  Other;Community Activity    Stability/Clinical Decision Making  Stable/Uncomplicated    Rehab Potential  Good    PT Frequency  2x / week    PT Duration  6 weeks    PT Treatment/Interventions  ADLs/Self Care Home Management;Cryotherapy;Electrical Stimulation;DME Instruction;Ultrasound;Moist Heat;Iontophoresis 4mg /ml Dexamethasone;Gait training;Stair training;Functional mobility training;Therapeutic activities;Therapeutic exercise;Balance training;Orthotic Fit/Training;Patient/family education;Neuromuscular re-education;Manual techniques;Taping;Dry needling;Passive range of motion;Spinal Manipulations;Joint Manipulations    PT Next Visit Plan  Review DF mob and stretch    PT Home Exercise Plan  [employer]    Consulted and Agree with Plan of Care  Patient       Patient will benefit from skilled therapeutic intervention in order to improve the following deficits and impairments:  Abnormal gait, Increased fascial restricitons, Pain, Increased muscle spasms, Decreased mobility, Decreased activity tolerance, Decreased range of motion, Decreased strength, Difficulty walking  Visit Diagnosis: Pain in left ankle and joints of left foot     Problem List Patient Active Problem List   Diagnosis Date Noted  . Achilles tendon injury, left, subsequent encounter 09/26/2019    Lyndee Hensen, PT, DPT 11:06 AM  10/23/19    [facility] [address] [place], [place], [address] Phone: [phone]   Fax:  [phone]  Name: Lori Mata MRN: [id number] Date of Birth: 1955/12/26

## 2019-10-24 ENCOUNTER — Ambulatory Visit: Payer: BC Managed Care – PPO | Admitting: Physical Therapy

## 2019-10-30 ENCOUNTER — Ambulatory Visit: Payer: BC Managed Care – PPO | Admitting: Physical Therapy

## 2019-11-01 ENCOUNTER — Other Ambulatory Visit: Payer: Self-pay

## 2019-11-01 ENCOUNTER — Ambulatory Visit: Payer: BC Managed Care – PPO | Admitting: Physical Therapy

## 2019-11-01 ENCOUNTER — Encounter: Payer: Self-pay | Admitting: Physical Therapy

## 2019-11-01 DIAGNOSIS — M25572 Pain in left ankle and joints of left foot: Secondary | ICD-10-CM | POA: Diagnosis not present

## 2019-11-01 NOTE — Therapy (Signed)
[facility] [address] [place], [place], [address] Phone: [phone]   Fax:  [phone]  Physical Therapy Treatment  Patient Details  Name: Lori Mata MRN: [id number] Date of Birth: Jan 26, 1956 Referring Provider (PT): Reino Bellis   Encounter Date: 11/01/2019  PT End of Session - 11/01/19 1006    Visit Number  4    Number of Visits  12    Date for PT Re-Evaluation  11/13/19    Authorization Type  BCBS    PT Start Time  1002    PT Stop Time  1041    PT Time Calculation (min)  39 min    Activity Tolerance  Patient tolerated treatment well    Behavior During Therapy  [facility] for tasks assessed/performed       Past Medical History:  Diagnosis Date  . Swelling in right armpit     Past Surgical History:  Procedure Laterality Date  . ABDOMINAL HYSTERECTOMY      There were no vitals filed for this visit.  Subjective Assessment - 11/01/19 1007    Subjective  " I can tell I am getting better. When I am walking its low,    Patient Stated Goals  Decreased pain, increased walking milage    Currently in Pain?  No/denies    Pain Score  0-No pain         [facility] PT Assessment - 11/01/19 0001      Assessment   Medical Diagnosis  L Achilles Injury    Referring Provider (PT)  Reino Bellis                   [facility] Adult PT Treatment/Exercise - 11/01/19 0001      Manual Therapy   Manual Therapy  Taping    Manual therapy comments  MTPR along the gastroc/ solues both medial/ lateral x 2 ea    Joint Mobilization  PA grade III to promote DF    Soft tissue mobilization  IASTM along gastroc/soleus and over medial/ lateral aspects of achilles tendon. active release techniques of the gastroc/soleus    Kinesiotex  Inhibit Muscle      Kinesiotix   Inhibit Muscle   gastroc/ soleus on the L      Ankle Exercises: Standing   Heel Raises Limitations  2 x 15 off edge of ste5   1 set con bil/ecc LLE   Other Standing Ankle  Exercises  self ankle mob with theraband using green band to promote DF             PT Education - 11/01/19 1244    Education Details  reviewed HEP and how to perform heel raise on edge of step with focus on eccentric lowering, using a band to perform self ankle DF mobs with forward rocking with the L foot forward    Person(s) Educated  Patient    Methods  Explanation;Verbal cues    Comprehension  Verbalized understanding;Verbal cues required       PT Short Term Goals - 10/04/19 1507      PT SHORT TERM GOAL #1   Title  Pt to be independent wtih initial HEP    Time  2    Period  Weeks    Status  New    Target Date  10/16/19      PT SHORT TERM GOAL #2   Title  Pt to report decreased pain in L foot to be 3/10 with activity  Time  2    Status  New    Target Date  10/16/19        PT Long Term Goals - 10/04/19 1508      PT LONG TERM GOAL #1   Title  Pt to be compliant with footwear appropriate for her foot type and Dx.    Time  6    Period  Weeks    Status  New    Target Date  11/13/19      PT LONG TERM GOAL #2   Title  Pt to report decreased pain in L foot, to 0-2/10 with walking up to 3 miles    Time  6    Period  Weeks    Status  New    Target Date  11/13/19      PT LONG TERM GOAL #3   Title  Pt to be independent with final HEP    Time  6    Period  Weeks    Status  New    Target Date  11/13/19            Plan - 11/01/19 1242    Clinical Impression Statement  pt continues to report mild pain along the medial aspect of the achilles. conintued STW and IASTM along the gastroc/ solues and achilles tendon. worked on self mobs using band in standing and heel raises with focus on eccentrics. trialed KT tape to promote gastroc/soleus inhibition.    PT Treatment/Interventions  ADLs/Self Care Home Management;Cryotherapy;Electrical Stimulation;DME Instruction;Ultrasound;Moist Heat;Iontophoresis 4mg /ml Dexamethasone;Gait training;Stair training;Functional  mobility training;Therapeutic activities;Therapeutic exercise;Balance training;Orthotic Fit/Training;Patient/family education;Neuromuscular re-education;Manual techniques;Taping;Dry needling;Passive range of motion;Spinal Manipulations;Joint Manipulations    PT Next Visit Plan  Review DF mob and stretch, ankle strengtheing, pilates  Reformer    PT Home Exercise Plan  [employer]    Consulted and Agree with Plan of Care  Patient       Patient will benefit from skilled therapeutic intervention in order to improve the following deficits and impairments:  Abnormal gait, Increased fascial restricitons, Pain, Increased muscle spasms, Decreased mobility, Decreased activity tolerance, Decreased range of motion, Decreased strength, Difficulty walking  Visit Diagnosis: Pain in left ankle and joints of left foot     Problem List Patient Active Problem List   Diagnosis Date Noted  . Achilles tendon injury, left, subsequent encounter 09/26/2019   Starr [place] PT, DPT, LAT, ATC  11/01/19  12:46 PM      [facility] [address] [place], [place], [address] Phone: [phone]   Fax:  [phone]  Name: Lori Mata MRN: [id number] Date of Birth: 1956-10-01

## 2019-11-06 ENCOUNTER — Encounter: Payer: BC Managed Care – PPO | Admitting: Physical Therapy

## 2019-11-08 ENCOUNTER — Other Ambulatory Visit: Payer: Self-pay

## 2019-11-08 ENCOUNTER — Ambulatory Visit: Payer: BC Managed Care – PPO | Admitting: Physical Therapy

## 2019-11-08 DIAGNOSIS — M25572 Pain in left ankle and joints of left foot: Secondary | ICD-10-CM

## 2019-11-08 NOTE — Therapy (Signed)
[place], [place], [address] Phone: [phone]   Fax:  [phone]  Physical Therapy Treatment  Patient Details  Name: Lori Mata MRN: [id number] Date of Birth: 12/31/1955 Referring Provider (PT): Lori Mata   Encounter Date: 11/08/2019  PT End of Session - 11/08/19 1140    Visit Number  5    Number of Visits  12    Date for PT Re-Evaluation  11/13/19    Authorization Type  BCBS    PT Start Time  1000    PT Stop Time  1044    PT Time Calculation (min)  44 min    Activity Tolerance  Patient tolerated treatment well    Behavior During Therapy  [facility] for tasks assessed/performed       Past Medical History:  Diagnosis Date  . Swelling in right armpit     Past Surgical History:  Procedure Laterality Date  . ABDOMINAL HYSTERECTOMY      There were no vitals filed for this visit.  Subjective Assessment - 11/08/19 1004    Subjective  " no pain today and I got new shoes and I really think is help"    Patient Stated Goals  Decreased pain, increased walking milage    Pain Score  0-No pain                       [facility] Adult PT Treatment/Exercise - 11/08/19 0001      Exercises   Exercises  Knee/Hip      Pilates   Pilates Reformer  see PT note      Knee/Hip Exercises: Standing   Heel Raises  2 sets;15 reps   on airex pad   Hip ADduction  2 sets;15 reps;Strengthening   with green theraband   Step Down  1 set;15 reps;Step Height: 8"   stepping down with RLE     Knee/Hip Exercises: Sidelying   Hip ABduction  Strengthening;2 sets;15 reps   CW/CCW x 15 ea.       Pilates Reformer:  - Leg press with heels on bar (foot work) 1 red, 1 yellow, blue and green  - Press with feet in straps (1 yellow, 1 blue and 1green)  - Frog press with feet in straps (1 yellow, 1 blue and 1green)  - Butterfly press with feet in straps (1 yellow, 1 blue and 1green) - DL heel raise (1 yellow, 1  blue and 1green)    Balance Exercises - 11/08/19 1040      Balance Exercises: Standing   SLS  Eyes open;2 reps   holding small ball ABC (pt ony able to get to C)         PT Short Term Goals - 10/04/19 1507      PT SHORT TERM GOAL #1   Title  Pt to be independent wtih initial HEP    Time  2    Period  Weeks    Status  New    Target Date  10/16/19      PT SHORT TERM GOAL #2   Title  Pt to report decreased pain in L foot to be 3/10 with activity    Time  2    Status  New    Target Date  10/16/19        PT Long Term Goals - 10/04/19 1508      PT LONG TERM GOAL #1   Title  Pt to  be compliant with footwear appropriate for her foot type and Dx.    Time  6    Period  Weeks    Status  New    Target Date  11/13/19      PT LONG TERM GOAL #2   Title  Pt to report decreased pain in L foot, to 0-2/10 with walking up to 3 miles    Time  6    Period  Weeks    Status  New    Target Date  11/13/19      PT LONG TERM GOAL #3   Title  Pt to be independent with final HEP    Time  6    Period  Weeks    Status  New    Target Date  11/13/19            Plan - 11/08/19 1141    Clinical Impression Statement  pt arrives to treatment reporting significant improvement with no pain. pt did have some weakness in bil hip abductors at 4/5 on the R and 4-/5 on the L, worked on proximal CKC strengthening and provided handout to work on at home. continued working on ankle strengtheneing and balance which she did well with. Utilized Metallurgist on foot work and bil LE strengthening with her feet in the straps she reported minor soreness in the lateral aspect of the L ankle which resolved with reduction of ankle eversion during exercise. pt reported no pain end of session. plan to review pilates based exercises and HEP with pt next session and if she contineus to do well potentially D/C.    PT Treatment/Interventions  ADLs/Self Care Home Management;Cryotherapy;Electrical Stimulation;DME  Instruction;Ultrasound;Moist Heat;Iontophoresis 4mg /ml Dexamethasone;Gait training;Stair training;Functional mobility training;Therapeutic activities;Therapeutic exercise;Balance training;Orthotic Fit/Training;Patient/family education;Neuromuscular re-education;Manual techniques;Taping;Dry needling;Passive range of motion;Spinal Manipulations;Joint Manipulations    PT Next Visit Plan  Review DF mob and stretch, ankle strengtheing, pilates  Reformer    PT Home Exercise Plan  standing hip abduction, SLS on LLE and UE ABC    Consulted and Agree with Plan of Care  Patient       Patient will benefit from skilled therapeutic intervention in order to improve the following deficits and impairments:  Abnormal gait, Increased fascial restricitons, Pain, Increased muscle spasms, Decreased mobility, Decreased activity tolerance, Decreased range of motion, Decreased strength, Difficulty walking  Visit Diagnosis: Pain in left ankle and joints of left foot     Problem List Patient Active Problem List   Diagnosis Date Noted  . Achilles tendon injury, left, subsequent encounter 09/26/2019   11/26/2019 PT, DPT, LAT, ATC  11/08/19  11:49 AM      [facility] [address] [place], [place], [place] Phone: [phone]   Fax:  [phone]  Name: Lori Mata MRN: [name] Date of Birth: 11-Nov-1956

## 2019-11-11 ENCOUNTER — Ambulatory Visit: Payer: BC Managed Care – PPO | Admitting: Physical Therapy

## 2019-11-20 ENCOUNTER — Ambulatory Visit: Payer: BC Managed Care – PPO | Attending: Sports Medicine | Admitting: Physical Therapy

## 2019-11-20 ENCOUNTER — Encounter: Payer: Self-pay | Admitting: Physical Therapy

## 2019-11-20 ENCOUNTER — Other Ambulatory Visit: Payer: Self-pay

## 2019-11-20 DIAGNOSIS — M25572 Pain in left ankle and joints of left foot: Secondary | ICD-10-CM

## 2019-11-20 NOTE — Therapy (Signed)
[place], [place], [address] Phone: [phone]   Fax:  [phone]  Physical Therapy Treatment/Discharge Patient Details  Name: Lori Mata MRN: [id number] Date of Birth: 29-Apr-1956 Referring Provider (PT): Lilia Argue   Encounter Date: 11/20/2019  PT End of Session - 11/20/19 1047    Visit Number  6    Number of Visits  12    PT Start Time  1000    PT Stop Time  4818    PT Time Calculation (min)  45 min       Past Medical History:  Diagnosis Date  . Swelling in right armpit     Past Surgical History:  Procedure Laterality Date  . ABDOMINAL HYSTERECTOMY      There were no vitals filed for this visit.  Subjective Assessment - 11/20/19 1008    Subjective  I have no pain.  I want to use the Reformer as I have one at home.    Currently in Pain?  No/denies          Pilates Reformer used for LE/core strength, postural strength, lumbopelvic disassociation and core control.  Exercises included:  Footwork  2 Red 1 blue   Heel raises 3 x 10 various positions  Single leg heel raises 2 red  10 each focus on eccentric   Sidelying foot work on medial and lateral edge of foot and on toes with hip ER  X 20 each LE, each position  Standing side splits for hip abduction 1 Red and then 1 Blue for adduction. Skater 1 Red   Single leg 3 way hip stretching 1 Red , included ankle DF.     3 way hip stretch and indirectly calf.  PT Education - 11/20/19 1339    Education Details  eccentrics, Reformer, ideas for home    Person(s) Educated  Patient    Methods  Explanation;Verbal cues    Comprehension  Verbalized understanding     bilateral Noted L anterior hip tightness  PT Short Term Goals - 11/20/19 1013      PT SHORT TERM GOAL #1   Title  Pt to be independent wtih initial HEP    Status  Achieved      PT SHORT TERM GOAL #2   Title  Pt to report decreased pain in L foot to be 3/10 with  activity    Status  Achieved        PT Long Term Goals - 11/20/19 1013      PT LONG TERM GOAL #1   Title  Pt to be compliant with footwear appropriate for her foot type and Dx.    Status  Achieved      PT LONG TERM GOAL #2   Title  Pt to report decreased pain in L foot, to 0-2/10 with walking up to 3 miles    Status  Achieved      PT LONG TERM GOAL #3   Title  Pt to be independent with final HEP    Status  Achieved            Plan - 11/20/19 1340    Clinical Impression Statement  Pt has met her goals. She has no pain at all and has not had any in 2 weeks. She is pleased with her progress and wil use her HEP and Reformer at home.    PT Next Visit Plan  NA    PT Home Exercise Plan  standing  hip abduction, SLS on LLE and UE ABC    Consulted and Agree with Plan of Care  Patient       Patient will benefit from skilled therapeutic intervention in order to improve the following deficits and impairments:     Visit Diagnosis: Pain in left ankle and joints of left foot     Problem List Patient Active Problem List   Diagnosis Date Noted  . Achilles tendon injury, left, subsequent encounter 09/26/2019    Kyre Jeffries 11/20/2019, 1:42 PM  [facility] [address] [place], [place], [address] Phone: [phone]   Fax:  [phone]  Name: Chiyoko Torrico MRN: [id number] Date of Birth: 1956-11-11  Raeford Razor, PT 11/20/19 1:44 PM Phone: [phone] Fax: [phone]

## 2020-04-14 ENCOUNTER — Other Ambulatory Visit: Payer: Self-pay

## 2020-04-14 ENCOUNTER — Ambulatory Visit: Payer: Self-pay

## 2020-04-14 ENCOUNTER — Ambulatory Visit (INDEPENDENT_AMBULATORY_CARE_PROVIDER_SITE_OTHER): Payer: BC Managed Care – PPO | Admitting: Sports Medicine

## 2020-04-14 VITALS — BP 112/76 | Ht 67.0 in | Wt 155.0 lb

## 2020-04-14 DIAGNOSIS — M65311 Trigger thumb, right thumb: Secondary | ICD-10-CM | POA: Diagnosis not present

## 2020-04-14 DIAGNOSIS — G5601 Carpal tunnel syndrome, right upper limb: Secondary | ICD-10-CM | POA: Diagnosis not present

## 2020-04-14 DIAGNOSIS — M25531 Pain in right wrist: Secondary | ICD-10-CM

## 2020-04-14 DIAGNOSIS — G5602 Carpal tunnel syndrome, left upper limb: Secondary | ICD-10-CM | POA: Insufficient documentation

## 2020-04-14 NOTE — Patient Instructions (Signed)
You do have carpal tunnel  On the Right it is moderate by the size of median nerve On the left it is mild  Keep up night splints Use Vitamin B6 100 mg each day (pyridoxine)  You have a trigger thumb on RT with a nodule Try warm water and massage  You can try an anti-inflammatory diet rich in nitrous oxide Beets Collards,  Kale Arugula Red spinach  Tumeric for osteoarthritis is 500 mg twice a day  The other option before surgery is trying a hydrodissection with saline and cortisone

## 2020-04-14 NOTE — Assessment & Plan Note (Signed)
I suggested trying some lifestyle modifications.  She will use some vitamin B6 for the tension.  Working her hands in warm water.  Been more consistent about her use of night splints.  If this continues troublesome we could try a Hydro dissection.  I reassured her that the surgery was generally successful and very quick procedure.

## 2020-04-14 NOTE — Progress Notes (Signed)
Chief complaint is right hand pain and milder left hand pain  Patient has seen an orthopedist at [facility] for hand problems over the last year.  Dr. Roney Mans has done 2 injections to her right carpal tunnel and one injection to her left.  Each of these injections gave 2 to 3 months of relief but the pain would come back.  She also has used night splints but more commonly on the right.  These do help but the right hand remains troublesome and causes her some tingling and weakness into her thumb and little finger on the right.  In addition she notices nodules over her hands and stiffness and making a good fist. She notes that her mother had significant hand arthritis  She has been recommended to have nerve conduction tests and surgery to her right hand but is hesitant to do that at this time.  She had a nerve conduction 7  years ago that was pretty painful.  An incidental problem is a leg length inequality this causing Achilles tendinitis in the past.  That has resolved.  She does worry that she needs a little more lift on the right however.  Review of systems No other joint swelling  she likes to walk for exercise No joint swelling Hand stiffness in the mornings Nighttime pain in the right hand  Physical examination Very pleasant older female in no acute distress BP 112/76   Ht 5\' 7"  (1.702 m)   Wt 155 lb (70.3 kg)   BMI 24.28 kg/m   Right hand show some mild spurring at the [facility] joint.  There are nodules at the PIP joints.  The MCP joint look pretty normal.  Phalen's and Tinel's sign over the carpal tunnel do produce some tingling into her fingers.  Left hand show some slight hypersensitivity to phalanxes no Carpal tunnel CMC joint looks normal.  PIP joints show no nodules  Ultrasound of hand and wrist right  CMC joint does not have an effusion and shows some minimal spurring. PIP joints show some cystic nodules and some joint space narrowing Right median nerve measures  0.17 cm which is abnormal with upper limits of normal being 0.12 cm There is some thickening of the transverse palmar ligament Left median nerve measures 0.14 cm which is above normal limits Long axis of the right median nerve does not reveal significant compression  Impression: Ultrasound findings are consistent with osteoarthritis of the hands.  There is moderate carpal tunnel syndrome by ultrasound criteria on the right and mild on the left  Ultrasound and interpretation by [facility] B. Libi Corso, MD   I did confirm her leg length difference being about 1 cm.  We added a live to her right insole of [place]on to help with this.

## 2020-04-14 NOTE — Assessment & Plan Note (Signed)
We will continue this conservative care at this point is not too troublesome now.  Use night splints and other measures as outlined

## 2020-04-28 ENCOUNTER — Ambulatory Visit (INDEPENDENT_AMBULATORY_CARE_PROVIDER_SITE_OTHER): Payer: BC Managed Care – PPO | Admitting: Sports Medicine

## 2020-04-28 ENCOUNTER — Other Ambulatory Visit: Payer: Self-pay

## 2020-04-28 VITALS — BP 110/82 | Ht 67.0 in | Wt 150.0 lb

## 2020-04-28 DIAGNOSIS — M1811 Unilateral primary osteoarthritis of first carpometacarpal joint, right hand: Secondary | ICD-10-CM

## 2020-04-28 DIAGNOSIS — M65311 Trigger thumb, right thumb: Secondary | ICD-10-CM | POA: Diagnosis not present

## 2020-04-28 MED ORDER — TRIAMCINOLONE ACETONIDE 10 MG/ML IJ SUSP
5.0000 mg | Freq: Once | INTRAMUSCULAR | Status: AC
  Administered 2020-04-28: 5 mg via INTRA_ARTICULAR

## 2020-04-28 NOTE — Assessment & Plan Note (Addendum)
Procedure:  Injection of nodule in peritendon of flexor hallucis at the MCP joint Consent obtained and verified. Time-out conducted. Noted no overlying erythema, induration, or other signs of local infection. Skin prepped in a sterile fashion. Topical analgesic spray: Ethyl chloride. Completed without difficulty.  I used Ultrasound guidance and was able to observe tendon sheath thickening and cystic change deep to flexor tendon.  Injection directed to this location. Meds: 1/2 cc kenalog 10 plus 1/2 cc lidocaine 1% Pain immediately improved suggesting accurate placement of the medication. Advised to call if fevers/chills, erythema, induration, drainage, or persistent bleeding. Sterling Big, MD   Worsening trigger thumb today.  Status post injection -Follow-up in 4 weeks Work hand for motion in warm water daily after 2 days of rest

## 2020-04-28 NOTE — Progress Notes (Signed)
    SUBJECTIVE:   CHIEF COMPLAINT / HPI:   Patient presents for follow-up for right thumb pain.  Patient was seen approximately 3 weeks ago and was diagnosed with right thumb CMC OA.  This morning when patient woke up she felt that her right thumb was swollen and that she could not bend it.  She found some exercises on the Internet pending encouraged both active and flexion and extension of the thumb.  These have helped.  She is also tried to use ice and heat.  These of also help relieve symptoms.  Patient denies any injury to the area.  It has gotten better through the day.  It does bend.  But able "catch" and pop.  These are unusual for the patient.  ROS RT carpal tunnel symptoms have improved somewhat with bracing No locking of left thumb  OBJECTIVE:   BP 110/82   Ht 5\' 7"  (1.702 m)   Wt 150 lb (68 kg)   BMI 23.49 kg/m    Right thumb Inspection: Appears slightly swollen compared to left Palpation: Tender to palpation at the palmar aspect of the thumb base at MTP ROM: Passive full range of motion, active can flex partially, can oppose easily to the fifth digit, will catch with flexion, palpable nodule at base of thumb Strength: Normal strength Stability: Thumb joint stable Special tests: Negative Phalen's  vascular studies:NVI   ASSESSMENT/PLAN:   Trigger thumb of right hand Worsening trigger thumb today.  Status post injection -Follow-up in 4 weeks     , MD [facility]

## 2020-05-01 ENCOUNTER — Encounter: Payer: BC Managed Care – PPO | Admitting: Vascular Surgery

## 2020-05-01 ENCOUNTER — Encounter (HOSPITAL_COMMUNITY): Payer: BC Managed Care – PPO

## 2020-05-15 ENCOUNTER — Encounter (HOSPITAL_COMMUNITY): Payer: BC Managed Care – PPO

## 2020-05-29 ENCOUNTER — Other Ambulatory Visit: Payer: Self-pay | Admitting: *Deleted

## 2020-05-29 DIAGNOSIS — M25569 Pain in unspecified knee: Secondary | ICD-10-CM

## 2020-06-02 ENCOUNTER — Ambulatory Visit (HOSPITAL_COMMUNITY)
Admission: RE | Admit: 2020-06-02 | Discharge: 2020-06-02 | Disposition: A | Discharge status: Home / Self Care | Payer: BC Managed Care – PPO | Source: Ambulatory Visit | Attending: Vascular Surgery | Admitting: Vascular Surgery

## 2020-06-02 ENCOUNTER — Ambulatory Visit (INDEPENDENT_AMBULATORY_CARE_PROVIDER_SITE_OTHER): Payer: BC Managed Care – PPO | Admitting: Physician Assistant

## 2020-06-02 ENCOUNTER — Other Ambulatory Visit: Payer: Self-pay

## 2020-06-02 ENCOUNTER — Encounter: Payer: Self-pay | Admitting: Sports Medicine

## 2020-06-02 ENCOUNTER — Ambulatory Visit (INDEPENDENT_AMBULATORY_CARE_PROVIDER_SITE_OTHER): Payer: BC Managed Care – PPO | Admitting: Sports Medicine

## 2020-06-02 VITALS — BP 109/80 | HR 78 | Temp 97.3°F | Resp 18 | Ht 66.5 in | Wt 154.3 lb

## 2020-06-02 DIAGNOSIS — M25569 Pain in unspecified knee: Secondary | ICD-10-CM | POA: Diagnosis not present

## 2020-06-02 DIAGNOSIS — I872 Venous insufficiency (chronic) (peripheral): Secondary | ICD-10-CM

## 2020-06-02 DIAGNOSIS — M65311 Trigger thumb, right thumb: Secondary | ICD-10-CM | POA: Diagnosis not present

## 2020-06-02 DIAGNOSIS — I868 Varicose veins of other specified sites: Secondary | ICD-10-CM

## 2020-06-02 NOTE — Progress Notes (Signed)
Requested by:  Deland Pretty, MD [address] [place],  [place] [address]  Reason for consultation: sticking pain in vein behind right knee    History of Present Illness   Lori Mata is a 64 y.o. (Jan 21, 1956) female who presents for evaluation of sticking pains behind right knee and visible varicose vein. She states that a couple months ago she was out on a walk and all the sudden felt a pinching pain behind her right knee. She walks approximately 5-6 miles several times a week. Since this initial event she continues to feel the discomfort sporadically only while walking. It will come and go throughout her walk. She describes it as a deep pinching pain. She also noticed more visible veins behind her knee since she started having the pain. Occasionally she will feel a tightness in the upper part of her right calf but otherwise she does not have pain in her legs or feet. She very intermittently will get calf cramps in the night in both legs. She has not tried any therapy for this. She said she once tried a heating pad but with minimal help. She denies any swelling, heaviness, burning, itching or tingling pains. She has no family history of venous disease or DVT. She has no personal history of DVT.  Venous symptoms include: pinching pain behind right knee Onset/duration:  Couple months Aggravating factors: walking Alleviating factors: rest Compression: never   Pain medications: none Previous vein procedures:  None History of DVT:  No  Past Medical History:  Diagnosis Date  . Arthritis   . Hyperlipidemia   . Swelling in right armpit     Past Surgical History:  Procedure Laterality Date  . ABDOMINAL HYSTERECTOMY      Social History   Socioeconomic History  . Marital status: Soil scientist    Spouse name: Not on file  . Number of children: Not on file  . Years of education: Not on file  . Highest education level: Not on file  Occupational History  . Not on file   Tobacco Use  . Smoking status: Former Smoker    Quit date: 06/03/1979    Years since quitting: 41.0  . Smokeless tobacco: Never Used  Substance and Sexual Activity  . Alcohol use: Yes  . Drug use: No  . Sexual activity: Not on file  Other Topics Concern  . Not on file  Social History Narrative  . Not on file   Social Determinants of Health   Financial Resource Strain:   . Difficulty of Paying Living Expenses:   Food Insecurity:   . Worried About Charity fundraiser in the Last Year:   . Arboriculturist in the Last Year:   Transportation Needs:   . Film/video editor (Medical):   Marland Kitchen Lack of Transportation (Non-Medical):   Physical Activity:   . Days of Exercise per Week:   . Minutes of Exercise per Session:   Stress:   . Feeling of Stress :   Social Connections:   . Frequency of Communication with Friends and Family:   . Frequency of Social Gatherings with Friends and Family:   . Attends Religious Services:   . Active Member of Clubs or Organizations:   . Attends Archivist Meetings:   Marland Kitchen Marital Status:   Intimate Partner Violence:   . Fear of Current or Ex-Partner:   . Emotionally Abused:   Marland Kitchen Physically Abused:   . Sexually Abused:  Current Outpatient Medications  Medication Sig Dispense Refill  . simvastatin (ZOCOR) 10 MG tablet TK 1 T PO D    . spironolactone (ALDACTONE) 25 MG tablet      No current facility-administered medications for this visit.    Allergies  Allergen Reactions  . Prednisone Rash    Oral steroid   REVIEW OF SYSTEMS (negative unless checked):   Cardiac:  []  Chest pain or chest pressure? []  Shortness of breath upon activity? []  Shortness of breath when lying flat? []  Irregular heart rhythm?  Vascular:  []  Pain in calf, thigh, or hip brought on by walking? []  Pain in feet at night that wakes you up from your sleep? []  Blood clot in your veins? []  Leg swelling?  Pulmonary:  []  Oxygen at home? []  Productive  cough? []  Wheezing?  Neurologic:  []  Sudden weakness in arms or legs? []  Sudden numbness in arms or legs? []  Sudden onset of difficult speaking or slurred speech? []  Temporary loss of vision in one eye? []  Problems with dizziness?  Gastrointestinal:  []  Blood in stool? []  Vomited blood?  Genitourinary:  []  Burning when urinating? []  Blood in urine?  Psychiatric:  []  Major depression  Hematologic:  []  Bleeding problems? []  Problems with blood clotting?  Dermatologic:  []  Rashes or ulcers?  Constitutional:  []  Fever or chills?  Ear/Nose/Throat:  []  Change in hearing? []  Nose bleeds? []  Sore throat?  Musculoskeletal:  []  Back pain? []  Joint pain? []  Muscle pain?   Physical Examination     Vitals:   06/02/20 1445  BP: 109/80  Pulse: 78  Resp: 18  Temp: (!) 97.3 F (36.3 C)  TempSrc: Temporal  SpO2: 98%  Weight: 154 lb 4.8 oz (70 kg)  Height: 5' 6.5" (1.689 m)   Body mass index is 24.53 kg/m.  General:  WDWN in NAD; vital signs documented above Gait: Normal HENT: WNL, normocephalic Pulmonary: normal non-labored breathing wheezing Cardiac: regular HR, without  Murmurs without carotid bruit Abdomen: soft, NT, no masses Vascular Exam/Pulses:  Right Left  Radial 2+ (normal) 2+ (normal)  Ulnar 2+ (normal) 2+ (normal)  Femoral 2+ (normal) 2+ (normal)  Popliteal 2+ (normal) 2+ (normal)  DP 2+ (normal) 2+ (normal)  PT 2+ (normal) 2+ (normal)   Extremities: without varicose veins, with reticular veins in right popliteal fossa- not tender to palpate, without edema, without stasis pigmentation, without lipodermatosclerosis, without ulcers Musculoskeletal: no muscle wasting or atrophy  Neurologic: A&O X 3;  No focal weakness or paresthesias are detected Psychiatric:  The pt has Normal affect.  Non-invasive Vascular Imaging   BLE Venous Insufficiency Duplex (06/02/20):   RLE:   No DVT and SVT  GSV reflux at SFjJ, proximal thigh and distal  thigh  GSV diameter 0.30-0.66  No SSV reflux   No deep venous reflux In the area of patient discomfort, a dilatation of the popliteal vein is observed and measures 1.73 cm.   Medical Decision Making   [name] is a 64 y.o. female who presents with: Right lower extremity venous insufficiency, with varicose veins with complications. She does have superficial venous insufficiency in her right greater saphenous vein. She has a sticking pain behind the right knee that is most disabling to her when ambulation. She has no popliteal reflux or SSV reflux. She does have noted dilation of her popliteal vein in this area of 1.73 cm. For this reason will have her return in 3 months to see Dr. or for monitoring  of this dilated area as well as potential further treatment of her venous insufficiency. Her GSV is relatively small but may be able to have ablation if her symptoms are not improved with conservative therapy   Based on the patient's history and examination, I recommend trial of conservative therapy with elevation, compression stockings and continued exercise. Warm compress behind right knee as needed  I discussed with the patient the use of her 20-30 mm thigh high compression stockings and need for 3 month trial of such.  The patient will follow up in 3 months with Dr. Darrick Penna or Dr. Edilia Bo  Thank you for allowing [place] to participate in this patient's care.   Graceann Congress, PA-C [facility] Office: [phone]  06/02/2020, 3:07 PM  Clinic MD: Dr.

## 2020-06-02 NOTE — Progress Notes (Signed)
   Lori Mata is a 64 y.o. female who presents to [facility] today for the following:  Right thumb pain f/u Patient presenting today for f/u of right thumb pain. Was last seen on 5/11 at which time patient was diagnosed with right thumb trigger finger and had injection of nodule of peritendon of flexor hallucis at the MCP joint.   Patient reports since injection she has done much better. States previously was getting "stuck" daily but now during the day she has no issues. Half the time she will wake up with her finger stuck but it is no longer daily. Denies any pain. Of note patient had carpel tunnel and wears wrist splints at night.   PMH reviewed. Carpel tunnel syndrrome ROS as above. Medications reviewed.  Exam:  BP 124/70   Ht 5\' 7"  (1.702 m)   Wt 153 lb (69.4 kg)   BMI 23.96 kg/m  Gen: Well NAD MSK: Hand: Inspection: No obvious deformity. No swelling, erythema or bruising Palpation: no TTP ROM: Full ROM of the digits and wrist. Fully able to extend and flex finger. Strength: 5/5 strength in the forearm, wrist and interosseus muscles Neurovascular: NV intact  Fingers:  Degenerative changes in PIP, DIP joints.  She has both Heberden;s and Osler nodes and mild joint deviation. PIP joint collateral ligaments are stable. Nodule palpated proximal to right thumb MCP  Able to get full grip with both hands    Assessment and Plan: 1) Trigger thumb of right hand Patient seems improved after injection. Is able to have better function and ROM without pain. Overall quality of life is improved. Advised daily warm water soaks with gentle massage of tendon. Can use voltaren gel or arnaca gel PRN. F/u PRN   , DO, PGY-3 [facility] Resident  06/02/2020 11:33 AM  I observed and examined the patient with the resident and agree with assessment and plan.  Note reviewed and modified by me. 06/04/2020, MD

## 2020-06-02 NOTE — Assessment & Plan Note (Addendum)
Patient seems improved after injection. Is able to have better function and ROM without pain. Overall quality of life is improved. Advised daily warm water soaks with gentle massage of tendon. Can use voltaren gel or arnaca gel PRN. F/u PRN

## 2020-08-17 ENCOUNTER — Other Ambulatory Visit: Payer: Self-pay | Admitting: Obstetrics and Gynecology

## 2020-08-17 DIAGNOSIS — Z1231 Encounter for screening mammogram for malignant neoplasm of breast: Secondary | ICD-10-CM

## 2020-09-09 ENCOUNTER — Ambulatory Visit: Payer: BC Managed Care – PPO | Admitting: Vascular Surgery

## 2020-10-07 ENCOUNTER — Other Ambulatory Visit: Payer: Self-pay

## 2020-10-07 ENCOUNTER — Ambulatory Visit
Admission: RE | Admit: 2020-10-07 | Discharge: 2020-10-07 | Disposition: A | Discharge status: Home / Self Care | Payer: BC Managed Care – PPO | Source: Ambulatory Visit | Attending: Obstetrics and Gynecology | Admitting: Obstetrics and Gynecology

## 2020-10-07 DIAGNOSIS — Z1231 Encounter for screening mammogram for malignant neoplasm of breast: Secondary | ICD-10-CM

## 2020-10-12 ENCOUNTER — Other Ambulatory Visit: Payer: Self-pay | Admitting: Obstetrics and Gynecology

## 2020-10-12 DIAGNOSIS — N6320 Unspecified lump in the left breast, unspecified quadrant: Secondary | ICD-10-CM

## 2020-10-20 ENCOUNTER — Ambulatory Visit: Payer: BC Managed Care – PPO

## 2020-10-20 ENCOUNTER — Ambulatory Visit
Admission: RE | Admit: 2020-10-20 | Discharge: 2020-10-20 | Disposition: A | Discharge status: Home / Self Care | Payer: BC Managed Care – PPO | Source: Ambulatory Visit | Attending: Obstetrics and Gynecology | Admitting: Obstetrics and Gynecology

## 2020-10-20 ENCOUNTER — Other Ambulatory Visit: Payer: Self-pay

## 2020-10-20 DIAGNOSIS — N6320 Unspecified lump in the left breast, unspecified quadrant: Secondary | ICD-10-CM

## 2020-11-05 ENCOUNTER — Ambulatory Visit: Payer: BC Managed Care – PPO | Admitting: Sports Medicine

## 2020-11-17 ENCOUNTER — Telehealth: Payer: Self-pay

## 2020-11-17 NOTE — Telephone Encounter (Signed)
NOTES ON FILE FROM [facility] [phone], SENT REFERRAL TO SCHEDULING 

## 2020-11-26 ENCOUNTER — Ambulatory Visit: Payer: BC Managed Care – PPO | Admitting: Cardiology

## 2020-11-29 IMAGING — MG MM DIGITAL DIAGNOSTIC UNILAT*L* W/ TOMO W/ CAD
6 series · 6 of 18 positions shown · non-contrast
Comparison: Previous exam(s).

CLINICAL DATA: 63-year-old female presenting as a recall from
screening for possible left breast mass.

EXAM:
DIGITAL DIAGNOSTIC UNILATERAL LEFT MAMMOGRAM WITH TOMO AND CAD

[L CC synth-2D (1 of 2)]
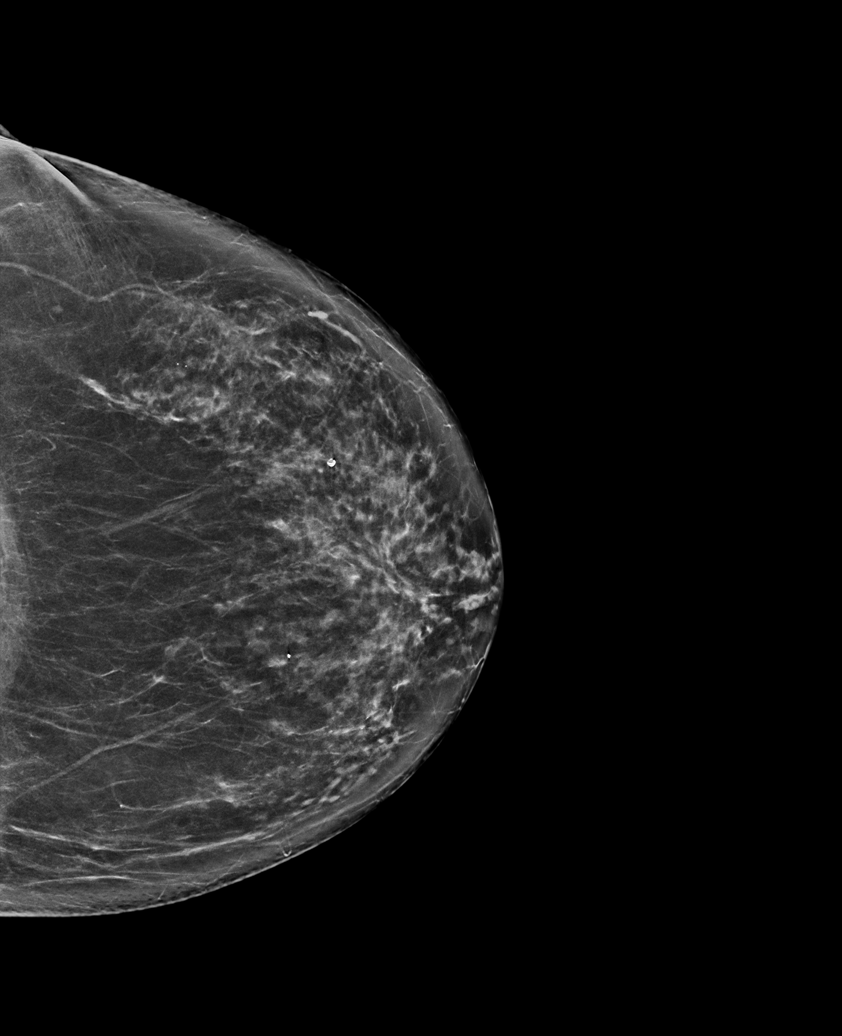

[L CC synth-2D (2 of 2)]
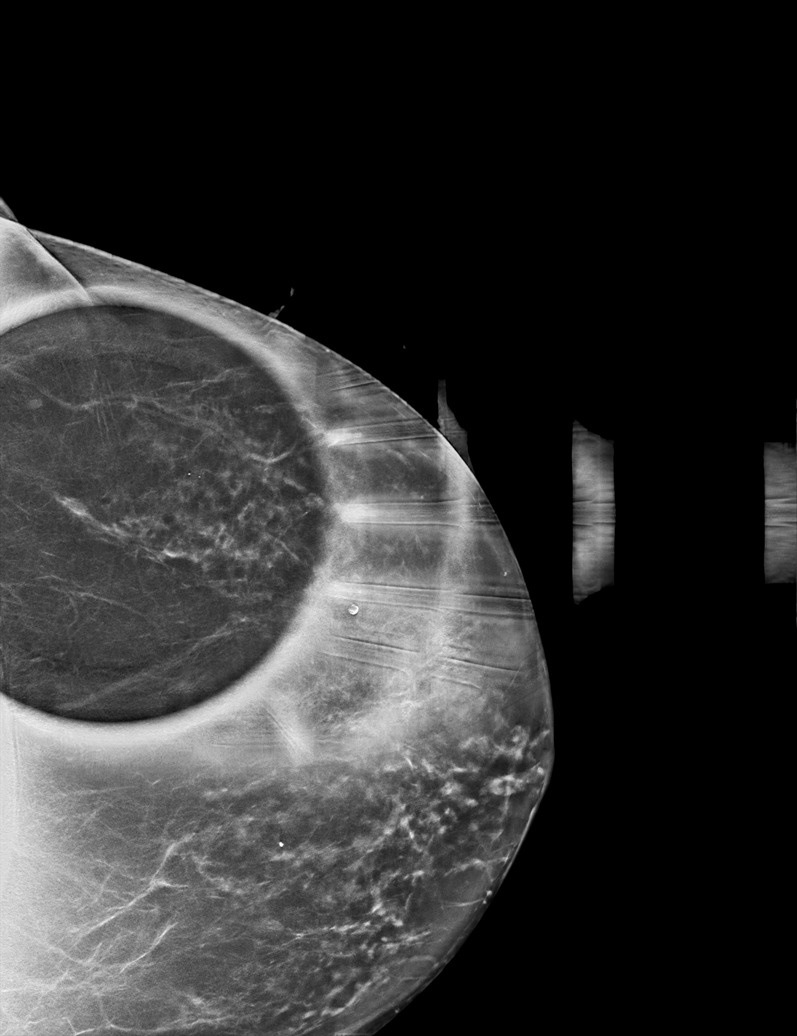

[L ML synth-2D]
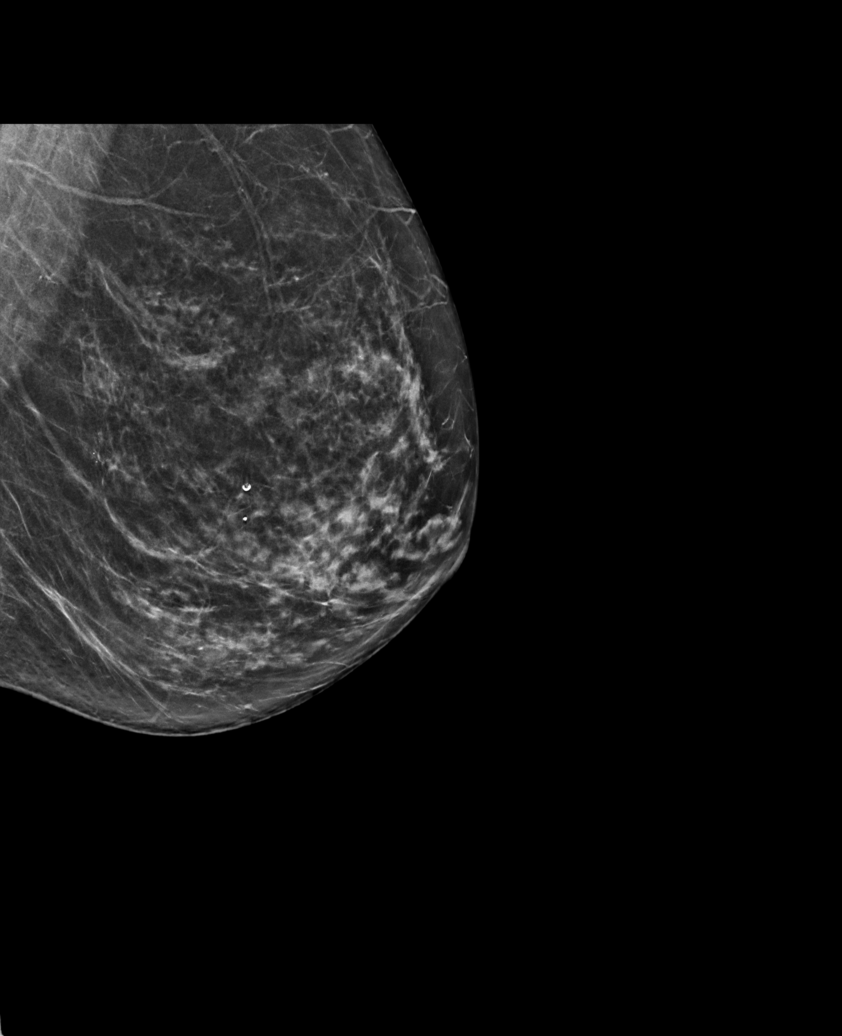

[L CC tomo (1 of 2) · tomo slice 39/76.0]
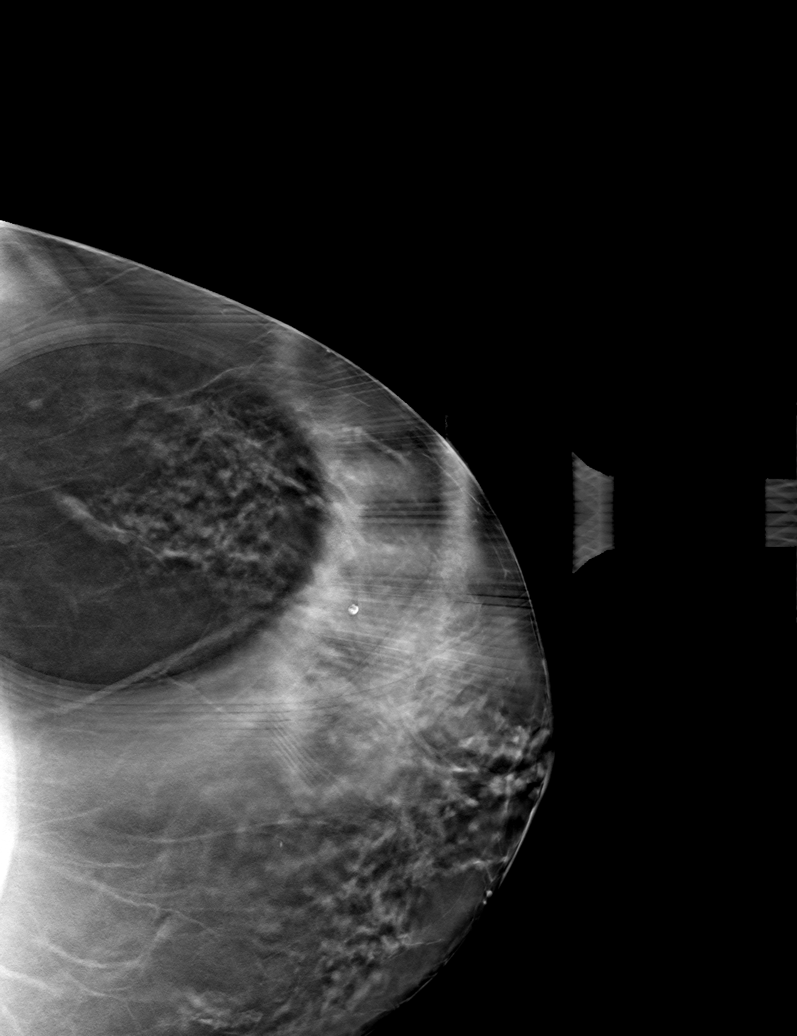

[L CC tomo (2 of 2) · tomo slice 37/73.0]
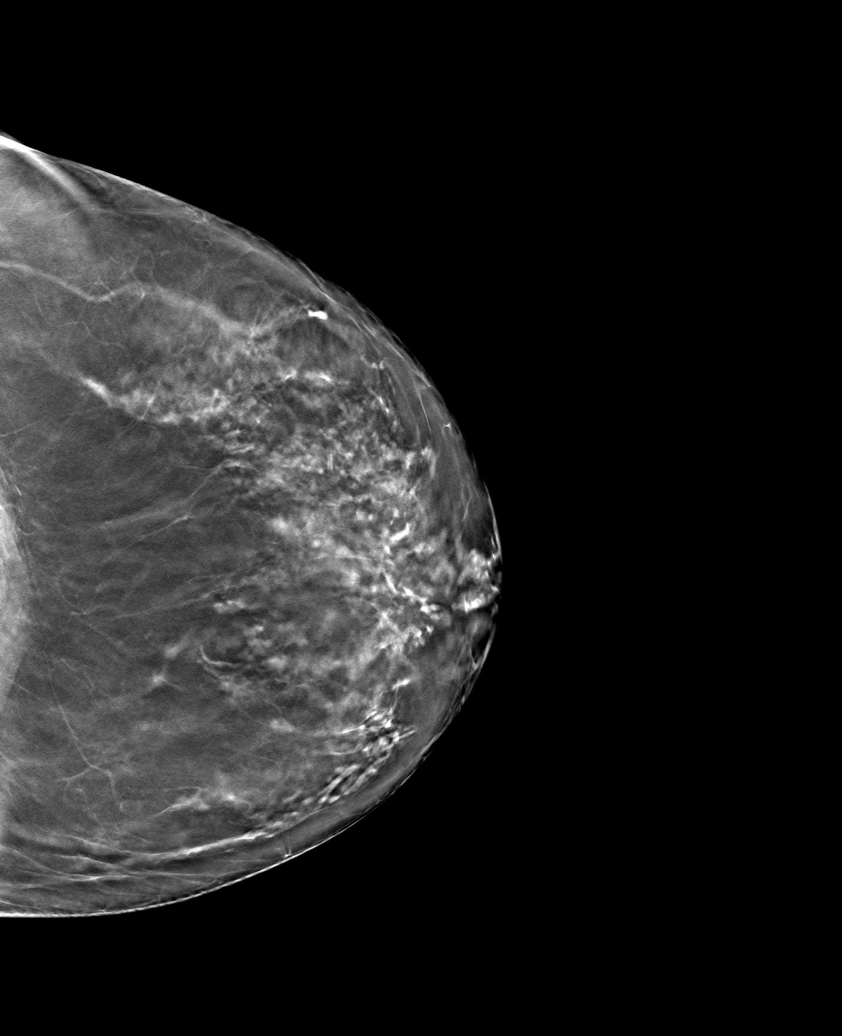

[L ML tomo · tomo slice 36/71.0]
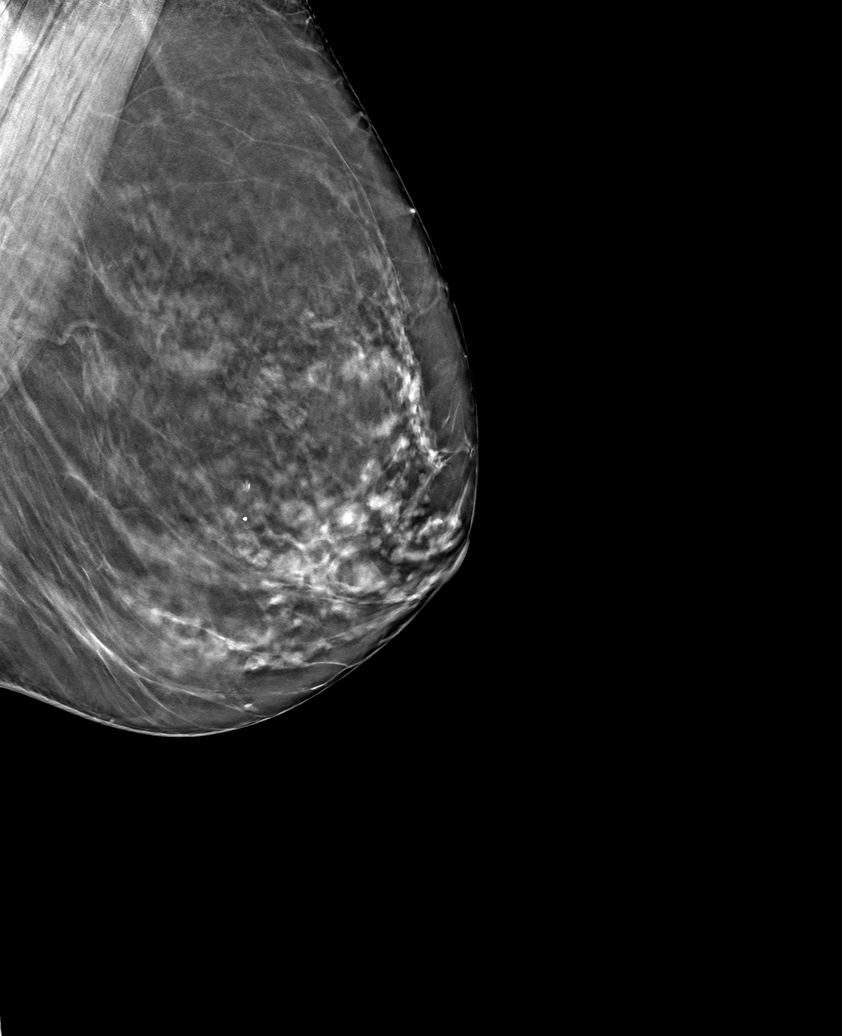

[6 of 18 positions shown; findings below may reference images not displayed]

ACR Breast Density Category c: The breast tissue is heterogeneously
dense, which may obscure small masses.
FINDINGS: Full field and spot compression tomosynthesis views of the left
breast performed. The previously seen possible small mass in the
outer left breast does not persist on any of today's imaging and may
have represented normal clumped fibroglandular tissue. There are no
new suspicious findings in the left breast.

Mammographic images were processed with CAD.
IMPRESSION: No mammographic evidence of malignancy in the left breast.

RECOMMENDATION:
Screening mammogram in one year.(Code:[id number]-M-1W1)

I have discussed the findings and recommendations with the patient.
If applicable, a reminder letter will be sent to the patient
regarding the next appointment.

BI-RADS CATEGORY  1: Negative.

## 2021-01-07 ENCOUNTER — Ambulatory Visit (INDEPENDENT_AMBULATORY_CARE_PROVIDER_SITE_OTHER): Payer: BLUE CROSS/BLUE SHIELD | Admitting: Podiatry

## 2021-01-07 ENCOUNTER — Other Ambulatory Visit: Payer: Self-pay

## 2021-01-07 DIAGNOSIS — L600 Ingrowing nail: Secondary | ICD-10-CM | POA: Diagnosis not present

## 2021-01-07 MED ORDER — NEOMYCIN-POLYMYXIN-HC 3.5-10000-1 OT SOLN: OTIC | 1 refills | Status: DC

## 2021-01-07 NOTE — Patient Instructions (Signed)

## 2021-01-07 NOTE — Progress Notes (Signed)
Subjective:   Patient ID: Lori Mata, female   DOB: 65 y.o.   MRN: [id number]   HPI Patient presents with chronic ingrown toenails of both feet stating that she has had them for years she has tried to trim and soak them without relief and she would like them corrected.  Patient does not smoke likes to be active   Review of Systems  All other systems reviewed and are negative.       Objective:  Physical Exam Vitals and nursing note reviewed.  Constitutional:      Appearance: She is well-developed and well-nourished.  Cardiovascular:     Pulses: Intact distal pulses.  Pulmonary:     Effort: Pulmonary effort is normal.  Musculoskeletal:        General: Normal range of motion.  Skin:    General: Skin is warm.  Neurological:     Mental Status: She is alert.     Neurovascular status intact muscle strength found to be adequate range of motion adequate.  Patient is found to have incurvation medial border hallux bilateral painful when pressed and making it hard to wear shoe gear comfortably.  Patient is found to have good digital perfusion well oriented x3     Assessment:  Chronic ingrown toenail deformity hallux bilateral with pain     Plan:  H&P reviewed condition and discussed treatment options.  At this point she is opted for permanent procedures and at this time I anesthetized 60 mg like Marcaine mixture each toe allowed her to read consent form going over treatment and risk and she signed consent form and I had sterile prep of each toe done and using sterile instrumentation removed the medial border exposed matrix applied phenol 3 applications 30 seconds followed by alcohol lavage sterile dressing and gave instructions on soaks and encouraged her to leave dressing on 24 hours but take it off earlier if any throbbing were to occur.  Patient will be seen back and is encouraged to call with questions concerns which may arise

## 2021-01-14 ENCOUNTER — Encounter: Payer: Self-pay | Admitting: Podiatry

## 2021-09-06 ENCOUNTER — Other Ambulatory Visit: Payer: Self-pay | Admitting: Obstetrics and Gynecology

## 2021-09-06 DIAGNOSIS — Z1231 Encounter for screening mammogram for malignant neoplasm of breast: Secondary | ICD-10-CM

## 2021-10-11 ENCOUNTER — Ambulatory Visit
Admission: RE | Admit: 2021-10-11 | Discharge: 2021-10-11 | Disposition: A | Discharge status: Home / Self Care | Payer: BC Managed Care – PPO | Source: Ambulatory Visit | Attending: Obstetrics and Gynecology | Admitting: Obstetrics and Gynecology

## 2021-10-11 ENCOUNTER — Other Ambulatory Visit: Payer: Self-pay

## 2021-10-11 DIAGNOSIS — Z1231 Encounter for screening mammogram for malignant neoplasm of breast: Secondary | ICD-10-CM

## 2022-02-21 ENCOUNTER — Ambulatory Visit: Payer: BC Managed Care – PPO | Admitting: Family Medicine

## 2022-02-21 DIAGNOSIS — M76891 Other specified enthesopathies of right lower limb, excluding foot: Secondary | ICD-10-CM

## 2022-02-21 MED ORDER — NITROGLYCERIN 0.2 MG/HR TD PT24: MEDICATED_PATCH | TRANSDERMAL | 1 refills | Status: DC

## 2022-02-21 NOTE — Patient Instructions (Signed)
You have proximal hamstring tendinopathy and ischial bursitis. ?Do home exercises once a day as directed. ?Ok to continue with your pilates - at most 3/10 level of discomfort when you are doing pilates. ?Nitro patches 1/4th patch to affected area, change daily. ?Icing 15 minutes at a time if needed. ?Tylenol, ibuprofen only if needed. ?Follow up with me in 6 weeks for reevaluation. ?

## 2022-02-21 NOTE — Progress Notes (Signed)
PCP: Merri Brunette, MD ? ?Subjective:  ? ?HPI: ?Patient is a 66 y.o. female here for right hip pain. ? ?Patient reports about 3 months ago she started to feel pain posterior right hip. ?Noticed when on Reformer in pilates, just felt uncomfortable in sitting area of posterior hip. ?Has noticed increasing tightness and decreased flexibility of right hip. ?No swelling or bruising. ?No numbness or tingling or radiation down leg. ?Bothers her most when sitting down now. ?No associated back pain. ? ?Past Medical History:  ?Diagnosis Date  ? Arthritis   ? Hyperlipidemia   ? Swelling in right armpit   ? ? ?Current Outpatient Medications on File Prior to Visit  ?Medication Sig Dispense Refill  ? cyclobenzaprine (FLEXERIL) 10 MG tablet cyclobenzaprine 10 mg tablet ? TAKE 1 TABLET BY MOUTH THREE TIMES DAILY    ? neomycin-polymyxin-hydrocortisone (CORTISPORIN) OTIC solution Apply 1-2 drops to toe after soaking BID 10 mL 1  ? simvastatin (ZOCOR) 10 MG tablet TK 1 T PO D    ? spironolactone (ALDACTONE) 25 MG tablet     ? ?No current facility-administered medications on file prior to visit.  ? ? ?Past Surgical History:  ?Procedure Laterality Date  ? ABDOMINAL HYSTERECTOMY    ? ? ?Allergies  ?Allergen Reactions  ? Prednisone Rash  ?  Oral steroid  ? ? ?BP 118/84   Ht 5\' 7"  (1.702 m)   Wt 150 lb (68 kg)   BMI 23.49 kg/m?  ? ?[facility] 02/21/2022  ?Frequency of aerobic exercise (# of days/week) 7  ?Average time in minutes 20  ?Frequency of strengthening activities (# of days/week) 6  ? ? ?No flowsheet data found. ? ?    ?Objective:  ?Physical Exam: ? ?Gen: NAD, comfortable in exam room ? ?Right hip: ?No deformity. ?FROM with 5/5 strength except 5-/5 with knee flexion reproducing her pain at proximal hamstring. ?Tenderness to palpation mildly at ischial tuberosity and proximal hamstring tendons. ?NVI distally. ?Negative logroll ?Negative faber, fadir, and piriformis stretches. ?  ?Assessment & Plan:  ?1.  Right hip pain - 2/2 proximal hamstring tendinopathy and ischial bursitis.  Home exercises reviewed today to do daily.  Start nitro patches - discussed risks of headache, skin irritation.  Icing, tylenol and/or ibuprofen if needed.  F/u in 6 weeks. ? ?Patient was instructed in 10 minutes of therapeutic exercises for right hamstring tendinopathy to improve strength, ROM and function according to my instructions and plan of care by a Certified Athletic Trainer during the office visit.  Proper technique shown and discussed, handout provided.  All questions discussed and answered.  ?

## 2022-02-22 ENCOUNTER — Encounter: Payer: Self-pay | Admitting: Family Medicine

## 2022-04-04 ENCOUNTER — Ambulatory Visit: Payer: BC Managed Care – PPO | Admitting: Family Medicine

## 2022-04-04 VITALS — BP 112/80 | Ht 67.0 in | Wt 150.0 lb

## 2022-04-04 DIAGNOSIS — M76891 Other specified enthesopathies of right lower limb, excluding foot: Secondary | ICD-10-CM | POA: Diagnosis not present

## 2022-04-04 NOTE — Progress Notes (Signed)
PCP: Merri Brunette, MD ? ?Subjective:  ? ?HPI: ?Patient is a 66 y.o. female here for bilateral hamstring pain. ? ?Patient was seen 1.5 months ago and found to have right proximal hamstring tendinopathy and ischial bursitis. She has been performing the prescribed home exercises diligently as well as continuing her daily pilates. She thinks this injury is about 25-30% better but is still limiting her. She describes tightness in the hamstring and discomfort at the ischial tuberosity when sitting. Tried the nitro patch but got bad headache so discontinued this. Occasionally taking ibuprofen.  ? ?Unfortunately, she also starting feeling left hamstring pain/tightness shortly after her last visit. This discomfort is located in the mid-hamstring. She notices that it feels much tighter than it used to - especially when trying to do pilates. The left hamstring and ischial tuberosity are not tender with pressure like the right one is. Hasn't noticed any weakness. She has been performing the same exercises prescribed for her right side in order to start rehabbing her left. ? ?Patient has an international trip in about a month with multiple hikes planned that she is hoping to be able to complete.  ? ? ?Past Medical History:  ?Diagnosis Date  ? Arthritis   ? Hyperlipidemia   ? Swelling in right armpit   ? ? ?Current Outpatient Medications on File Prior to Visit  ?Medication Sig Dispense Refill  ? cyclobenzaprine (FLEXERIL) 10 MG tablet cyclobenzaprine 10 mg tablet ? TAKE 1 TABLET BY MOUTH THREE TIMES DAILY    ? neomycin-polymyxin-hydrocortisone (CORTISPORIN) OTIC solution Apply 1-2 drops to toe after soaking BID 10 mL 1  ? nitroGLYCERIN (NITRODUR - DOSED IN MG/24 HR) 0.2 mg/hr patch Apply 1/4th patch to affected area, change daily (Patient not taking: Reported on 04/04/2022) 30 patch 1  ? simvastatin (ZOCOR) 10 MG tablet TK 1 T PO D    ? spironolactone (ALDACTONE) 25 MG tablet     ? ?No current facility-administered medications on  file prior to visit.  ? ? ?Past Surgical History:  ?Procedure Laterality Date  ? ABDOMINAL HYSTERECTOMY    ? ? ?Allergies  ?Allergen Reactions  ? Prednisone Rash  ?  Oral steroid  ? ? ?BP 112/80   Ht 5\' 7"  (1.702 m)   Wt 150 lb (68 kg)   BMI 23.49 kg/m?  ? ? ?  02/21/2022  ? 10:55 AM  ?[facility]  ?Frequency of aerobic exercise (# of days/week) 7  ?Average time in minutes 20  ?Frequency of strengthening activities (# of days/week) 6  ? ? ?   ? View : No data to display.  ?  ?  ?  ? ? ?    ?Objective:  ?Physical Exam: ?Gen: NAD, comfortable in exam room ?CV: Regular rate, well perfused ?Resp: No increased work of breathing, coughing or wheezing ?Psych: Normal mood and affect.  ?MSK: No swelling or gross deformity in bilateral hips. Mild TTP over the right ischial tuberosity. Full ROM in hip flexion and knee extension/flexion. Good strength in knee extension and flexion. Does have discomfort primarily at the right ischial tuberosity with resisted knee flexion, pain most prominent with knee at 30 degrees. Also has discomfort in the left mid-hamstring with resisted knee flexion. Negative logroll. Neurovascularly intact distally. ?  ?Assessment & Plan:  ?1. Bilateral hamstring pain - Patient presents with ongoing right proximal hamstring pain and tenderness consistent with proximal hamstring tendinopathy and ischial bursitis as well as new onset left hamstring pain and tightness  consistent with a mid-hamstring overuse strain. The right-sided proximal tendinopathy has partially responded to a home exercise program but has not improved significantly. Unfortunately, the patient was unable to continue nitroglycerin patches due to headaches. We discussed next steps including possible formal physical therapy - made a shared decision to trial shockwave therapy first. First session was completed today. Patient to follow up for 4 sessions. Patient will continue Askling protocol at home for further  rehabilitation. Her left mid-hamstring strain will likely heal quicker than the right given the location of the strain. Advised patient to continue with home exercise protocol on this side as well and will follow up.  ? ?Festus Aloe ?MS4, [employer] [facility] ? ?Procedure: ECSWT ?Indications:  right proximal hamstring tendinopathy ?  ?Procedure Details ?Consent: Risks of procedure as well as the alternatives and risks of each were explained to the patient.  Written consent for procedure obtained. ?Time Out: Verified patient identification, verified procedure, site was marked, verified correct patient position, medications/allergies/relevent history reviewed.  The area was cleaned with alcohol swab.   ?  ?The right proximal hamstring was targeted for Extracorporeal shockwave therapy.  ?  ?Preset: trochanteric bursitis ?Power Level: 90 ?Frequency: 10 ?Impulse/cycles: 2000 ?Head size: large ?  ?Patient tolerated procedure well without immediate complications ?  ? ? ? ?

## 2022-04-05 ENCOUNTER — Encounter: Payer: Self-pay | Admitting: Family Medicine

## 2022-04-11 ENCOUNTER — Ambulatory Visit (INDEPENDENT_AMBULATORY_CARE_PROVIDER_SITE_OTHER): Payer: Self-pay | Admitting: Family Medicine

## 2022-04-11 DIAGNOSIS — M76891 Other specified enthesopathies of right lower limb, excluding foot: Secondary | ICD-10-CM

## 2022-04-11 NOTE — Progress Notes (Signed)
Patient returns for second shockwave treatment for right proximal hamstring tendinopathy and ischial bursitis.  She reported much improvement in her pain same day as last treatment and overall mild improvement in her symptoms. No side effects. ? ?Procedure: ECSWT ?Indications:  right hamstring tendinopathy, ischial bursitis ?  ?Procedure Details ?Consent: Risks of procedure as well as the alternatives and risks of each were explained to the patient.  Written consent for procedure obtained. ?Time Out: Verified patient identification, verified procedure, site was marked, verified correct patient position, medications/allergies/relevent history reviewed.  The area was cleaned with alcohol swab.   ?  ?The right proximal hamstring was targeted for Extracorporeal shockwave therapy.  ?  ?Preset: trochanteric bursitis ?Power Level: 100 ?Frequency: 10 ?Impulse/cycles: 2000 ?Head size: large ?  ?Patient tolerated procedure well without immediate complications ?  ? ?

## 2022-04-21 ENCOUNTER — Ambulatory Visit (INDEPENDENT_AMBULATORY_CARE_PROVIDER_SITE_OTHER): Payer: Self-pay | Admitting: Sports Medicine

## 2022-04-21 ENCOUNTER — Ambulatory Visit: Payer: BC Managed Care – PPO | Admitting: Sports Medicine

## 2022-04-21 DIAGNOSIS — M76891 Other specified enthesopathies of right lower limb, excluding foot: Secondary | ICD-10-CM

## 2022-04-21 NOTE — Progress Notes (Signed)
? ?  AUDELL WYMAN is a 66 y.o. female who presents to [facility] today for the following: ? ?Right Hamstring Tendinitis ?Patient presents for her third proximal hamstring tendinopathy and ischial bursitis shockwave therapy ?She has been noticing improvement in her pain ? ? ?Procedure: ECSWT ?Indications: Right hamstring tendinopathy, ischial bursitis ?  ?Procedure Details ?Consent: Risks of procedure as well as the alternatives and risks of each were explained to the patient.  Written consent for procedure obtained. ?Time Out: Verified patient identification, verified procedure, site was marked, verified correct patient position, medications/allergies/relevent history reviewed.  The area was cleaned with alcohol swab.   ?  ?The right proximal hamstring was targeted for Extracorporeal shockwave therapy.  ?  ?Preset: Trochanteric bursitis ?Power Level: 120 ?Frequency: 10 ?Impulse/cycles: 2000 ?Head size: Large ?  ?Patient tolerated procedure well without immediate complications ?She plans to have another treatment next week and then will be traveling for 2 weeks.  We recommend follow-up after she returns from travel to determine if she needs more treatments. ? ? ?[name], D.O.  ?PGY-4 [facility]  ?04/21/2022 2:05 PM ? ?Patient seen and evaluated with the sports medicine fellow.  I agree with the above plan of care.  Third shockwave treatment administered as above.  Follow-up next week as scheduled for her fourth treatment. ?

## 2022-04-27 ENCOUNTER — Ambulatory Visit (INDEPENDENT_AMBULATORY_CARE_PROVIDER_SITE_OTHER): Payer: Self-pay | Admitting: Family Medicine

## 2022-04-27 DIAGNOSIS — M76891 Other specified enthesopathies of right lower limb, excluding foot: Secondary | ICD-10-CM

## 2022-04-27 NOTE — Progress Notes (Signed)
? ?  Lori Mata is a 66 y.o. female who presents to [facility] today for the following: ? ?Right hamstring tendinitis ?Patient presents today for her fourth proximal hamstring tendinopathy and ischial bursitis shockwave therapy ?Having significant improvement in her pain and thinks that is very helpful ? ?Procedure: ECSWT ?Indications: Right hamstring tendinopathy, ischial bursitis ?  ?Procedure Details ?Consent: Risks of procedure as well as the alternatives and risks of each were explained to the patient.  Written consent for procedure obtained. ?Time Out: Verified patient identification, verified procedure, site was marked, verified correct patient position, medications/allergies/relevent history reviewed.  The area was cleaned with alcohol swab.   ?  ?The right proximal hamstring was targeted for Extracorporeal shockwave therapy.  ?  ?Preset: Trochanteric bursitis ?Power Level: 120  ?Frequency: 16 ?Impulse/cycles: 2000 ?Head size: large ?  ?Patient tolerated procedure well without immediate complications ?She will be going on a trip for 2 weeks and plans to see how she is doing at that time ?She will call if she would like to schedule more treatments when she gets back ? ? ?Luis Abed, D.O.  ?PGY-4 [facility]  ?04/27/2022 8:23 AM ? ?Addendum:  I was the preceptor for this visit and available for immediate consultation.  Norton Blizzard MD CAQSM ? ?

## 2022-04-28 ENCOUNTER — Ambulatory Visit: Payer: BC Managed Care – PPO | Admitting: Sports Medicine

## 2022-08-02 ENCOUNTER — Ambulatory Visit (INDEPENDENT_AMBULATORY_CARE_PROVIDER_SITE_OTHER): Payer: BC Managed Care – PPO | Admitting: Sports Medicine

## 2022-08-02 ENCOUNTER — Ambulatory Visit
Admission: RE | Admit: 2022-08-02 | Discharge: 2022-08-02 | Disposition: A | Discharge status: Home / Self Care | Payer: Medicare Other | Source: Ambulatory Visit | Attending: Sports Medicine | Admitting: Sports Medicine

## 2022-08-02 VITALS — BP 114/70 | Ht 67.0 in | Wt 150.0 lb

## 2022-08-02 DIAGNOSIS — M25562 Pain in left knee: Secondary | ICD-10-CM

## 2022-08-02 NOTE — Progress Notes (Addendum)
PCP: Merri Brunette, MD  Subjective:   HPI: Patient is a 66 y.o. female here for left knee pain.  Patient describes 1 month of left knee pain. No acute injury or trauma but it was sudden onset. Her pani is most obvious when she is kneeling. She has pain going up and down stairs and feels as though her knee feels weak. No locking or giving way. She does have clicking and popping and feels like "something is floating around in her knee." She ices and takes ibuprofen occasionally for the pain. She does pilates daily.   Past Medical History:  Diagnosis Date   Arthritis    Hyperlipidemia    Swelling in right armpit     Current Outpatient Medications on File Prior to Visit  Medication Sig Dispense Refill   cyclobenzaprine (FLEXERIL) 10 MG tablet cyclobenzaprine 10 mg tablet  TAKE 1 TABLET BY MOUTH THREE TIMES DAILY     neomycin-polymyxin-hydrocortisone (CORTISPORIN) OTIC solution Apply 1-2 drops to toe after soaking BID 10 mL 1   nitroGLYCERIN (NITRODUR - DOSED IN MG/24 HR) 0.2 mg/hr patch Apply 1/4th patch to affected area, change daily (Patient not taking: Reported on 04/04/2022) 30 patch 1   simvastatin (ZOCOR) 10 MG tablet TK 1 T PO D     spironolactone (ALDACTONE) 25 MG tablet      No current facility-administered medications on file prior to visit.    Past Surgical History:  Procedure Laterality Date   ABDOMINAL HYSTERECTOMY      Allergies  Allergen Reactions   Prednisone Rash    Oral steroid    BP 114/70   Ht 5\' 7"  (1.702 m)   Wt 150 lb (68 kg)   BMI 23.49 kg/m      02/21/2022   10:55 AM  [facility]  Frequency of aerobic exercise (# of days/week) 7  Average time in minutes 20  Frequency of strengthening activities (# of days/week) 6        No data to display              Objective:  Physical Exam:  Gen: NAD, comfortable in exam room Knee, left: Inspection was negative for erythema, ecchymosis, and effusion. No obvious bony  abnormalities or signs of osteophyte development. Palpation yielded no asymmetric warmth; No joint line tenderness; No condyle tenderness; No patellar tenderness; knee crepitus at terminal extension. Patellar and quadriceps tendons unremarkable, and no tenderness of the pes anserine bursa. No obvious Baker's cyst development. ROM normal in flexion (135 degrees) and extension (0 degrees). Strength 5/5 with knee flexion and extension. Neurovascularly intact bilaterally.   Provocative Testing:    - Patella:   - Patellar grind/compression: NEG   - Patellar glide: Appropriate medial/lateral glide without apprehension - Cruciate Ligaments:   - Anterior Drawer/Lachman test: NEG - Posterior Drawer: NEG  - Collateral Ligaments:   - Varus/Valgus (MCL/LCL) Stress test at 0, 15d: NEG  - Meniscus:   - Thessaly: Positive   - McMurray's: NEG  Limited ultrasound of the left knee showed no effusion in the suprapatellar pouch. Lateral meniscus mildly bulging beyond the articulating surfaces with no obvious tear. Mild bulging of medial meniscus without obvious tear. Bone spur noted on the patella without significant cartilage loss on transverse scan of flexed knee.  Findings consistent with minimal degenerative changes.   Assessment & Plan:  1. Left knee pain likely due to degenerative disease. Recommend x-rays of the knee for further evaluation. Recommend physical therapy and  home exercises. Can take ibuprofen or Aleve as needed. Discussed using compression for comfort and modifying activities. Follow up in 5-6 weeks.   Timoteo Expose, MS4  Patient seen and evaluated with the medical student.  I agree with the above plan of care.  Tenisha's symptoms are likely secondary to some mild patellofemoral DJD.  She does have palpable crepitus with terminal extension and her pain appears to be retropatellar.  Her ultrasound is reassuring but I would like to also check a set of x-rays including a sunrise view to evaluate  degree of patellofemoral DJD present.  We will follow-up with her with the x-ray results when available.  In the meantime, I think she would benefit from physical therapy.  She will follow-up with me again in 5 to 6 weeks.  This note was dictated using Dragon naturally speaking software and may contain errors in syntax, spelling, or content which have not been identified prior to signing this note.   Addendum (08/08/2022): X-rays reviewed.  They do confirm some mild patellofemoral osteoarthritis.

## 2022-08-04 NOTE — Addendum Note (Signed)
Addended by: Annita Brod on: 08/04/2022 09:58 AM   Modules accepted: Orders

## 2022-08-08 ENCOUNTER — Encounter: Payer: Self-pay | Admitting: *Deleted

## 2022-08-18 ENCOUNTER — Encounter: Payer: Self-pay | Admitting: Physical Therapy

## 2022-08-18 ENCOUNTER — Ambulatory Visit: Payer: BC Managed Care – PPO | Attending: Sports Medicine | Admitting: Physical Therapy

## 2022-08-18 DIAGNOSIS — R262 Difficulty in walking, not elsewhere classified: Secondary | ICD-10-CM | POA: Insufficient documentation

## 2022-08-18 DIAGNOSIS — M25562 Pain in left knee: Secondary | ICD-10-CM | POA: Diagnosis present

## 2022-08-18 NOTE — Therapy (Signed)
OUTPATIENT PHYSICAL THERAPY LOWER EXTREMITY EVALUATION   Patient Name: Lori Mata MRN: [id number] DOB:[birth year], 66 y.o., female Today's Date: 08/18/2022    Past Medical History:  Diagnosis Date   Arthritis    Hyperlipidemia    Swelling in right armpit    Past Surgical History:  Procedure Laterality Date   ABDOMINAL HYSTERECTOMY     Patient Active Problem List   Diagnosis Date Noted   Carpal tunnel syndrome, right 04/14/2020   Carpal tunnel syndrome, left 04/14/2020   Trigger thumb of right hand 04/14/2020   Achilles tendon injury, left, subsequent encounter 09/26/2019    PCP: Merri Brunette, MD  REFERRING PROVIDER: Dr. Reino Bellis  REFERRING DIAG: Acute knee pain   THERAPY DIAG:  No diagnosis found.  Rationale for Evaluation and Treatment Rehabilitation  ONSET DATE: 2 mos  SUBJECTIVE:   SUBJECTIVE STATEMENT: Patient reports new onset of L knee symptoms for about 2 mos. Her symptoms are mild overall and do not   limit her functionally.  She does want to be able to prevent further injury. Noticed it when kneeling.  Notices it clicks and makes noise, felt kind of weak and vulnerable.    Lt Leg longer than the Rt. She is  used to walking up to 5 miles.at a time but not lately due to the heat.  Discomfort with return from squatting.  Knee does not wake her up and does not swell.  She does do Pilates on a near daily basis at [employer] and has a Actuary.   PERTINENT HISTORY: Achilles pain , Rt proximal hamstring bursitis.    PAIN:  Are you having pain? Yes: NPRS scale: 1/10 Pain location: L knee anterior  Pain description: awareness...  Aggravating factors: kneeling Relieving factors: ibuprofen, ice, has not used a a brace   PRECAUTIONS: None  WEIGHT BEARING RESTRICTIONS No  FALLS:  Has patient fallen in last 6 months? No  LIVING ENVIRONMENT: Lives with: lives with their family and lives with their partner Lives in: House/apartment Stairs: Yes:  Internal: 12 steps; on right going up Has following equipment at home: None  OCCUPATION: Just retired  PLOF: Independent , likes to exercise, travelling, dogs, reading   PATIENT GOALS I want to be able to make sure I know what to do with present worsening knee    OBJECTIVE:   DIAGNOSTIC FINDINGS: Mild degenerative joint changes of left knee.  PATIENT SURVEYS:  FOTO 77%  COGNITION:  Overall cognitive status: Within functional limits for tasks assessed     SENSATION: WFL  EDEMA:  none  MUSCLE LENGTH: Hamstrings: Right 18 deg; Left 28 deg   POSTURE: No Significant postural limitations  PALPATION: No pain with patellar mobilization or medial/lateral joint line Pain with compression to patella   LOWER EXTREMITY ROM:  Active ROM Right eval Left eval  Hip flexion    Hip extension    Hip abduction    Hip adduction    Hip internal rotation    Hip external rotation    Knee flexion 140 140 min pain with overpressure   Knee extension 0 2  Ankle dorsiflexion    Ankle plantarflexion    Ankle inversion    Ankle eversion     (Blank rows = not tested)  LOWER EXTREMITY MMT:  MMT Right eval Left eval  Hip flexion 5 5  Hip extension 5 5  Hip abduction 5 5, 4/5 glute med   Hip adduction    Hip internal rotation  Hip external rotation    Knee flexion 5 5  Knee extension 5 5  Ankle dorsiflexion    Ankle plantarflexion    Ankle inversion    Ankle eversion     (Blank rows = not tested)  FUNCTIONAL TESTS:  5 times sit to stand: 8 sec   Step up no pain, audible crepitus   SLS 30 sec bilateral   Lunge with L knee discomfort when forward  GAIT: Distance walked: 150 Assistive device utilized: None Level of assistance: Complete Independence Comments: no deviations     TODAY'S TREATMENT: PT eval, HEP, discussion of Pilates and anatomy, muscle hypertrophy  PATIENT EDUCATION:  Education details: see above Person educated: Patient Education method:  Programmer, multimedia, Demonstration, Verbal cues, and Handouts Education comprehension: verbalized understanding and returned demonstration   HOME EXERCISE PROGRAM: Access Code: [email or web address] URL: [email or web address] Date: 08/18/2022 Prepared by: Karie Mainland  Exercises - Supine Bridge with Resistance Band  - 1 x daily - 7 x weekly - 2 sets - 10 reps - 10 hold - Wall Quarter Squat  - 1 x daily - 7 x weekly - 1 sets - 5 reps - 30 hold - Supine Hamstring Stretch with Strap  - 1 x daily - 7 x weekly - 1 sets - 5 reps - 30 hold - Supine ITB Stretch with Strap  - 1 x daily - 7 x weekly - 1 sets - 5 reps - 30 hold - Standing Quadriceps Stretch  - 1 x daily - 7 x weekly - 1 sets - 5 reps - 30 hold  ASSESSMENT:  CLINICAL IMPRESSION: Patient is a 66 y.o. female who was seen today for physical therapy evaluation and treatment for L knee pain due to patellofemoral DJD.    OBJECTIVE IMPAIRMENTS decreased mobility, difficulty walking, decreased strength, hypomobility, impaired flexibility, and pain.   ACTIVITY LIMITATIONS standing, squatting, and stairs  PARTICIPATION LIMITATIONS: interpersonal relationship and community activity  PERSONAL FACTORS 1 comorbidity: previous musculoskeletal injury  are also affecting patient's functional outcome.   REHAB POTENTIAL: Excellent  CLINICAL DECISION MAKING: Stable/uncomplicated  EVALUATION COMPLEXITY: Low   GOALS:  LONG TERM GOALS: Target date: 09/29/2022   Pt will be able to negotiate stairs without feelings of instability, weakness or pain  Baseline:  Goal status: INITIAL  2.  Pt will increase FOTO score to > 71% Baseline:  Goal status: INITIAL  3.  Pt will be able to squat, lift without knee pain  Baseline:  Goal status: INITIAL  4.  Pt will be I with HEP for strengthening hips, core, knee Baseline:  Goal status: INITIAL    PLAN: PT FREQUENCY: 1x/week  PT DURATION: 6 weeks  PLANNED INTERVENTIONS: Therapeutic exercises,  Therapeutic activity, Neuromuscular re-education, Balance training, Gait training, Patient/Family education, Self Care, Joint mobilization, Cryotherapy, Moist heat, Taping, Manual therapy, and Re-evaluation  PLAN FOR NEXT SESSION: check HEP, step downs.  Reformer   Yohana Bartha, PT 08/18/2022, 9:06 AM   Karie Mainland, PT 08/18/22 1:18 PM Phone: [phone] Fax: [phone]

## 2022-08-23 ENCOUNTER — Other Ambulatory Visit: Payer: Self-pay | Admitting: Internal Medicine

## 2022-08-23 DIAGNOSIS — Z1231 Encounter for screening mammogram for malignant neoplasm of breast: Secondary | ICD-10-CM

## 2022-08-25 NOTE — Therapy (Signed)
OUTPATIENT PHYSICAL THERAPY TREATMENT NOTE   Patient Name: Lori Mata MRN: [id number] DOB:1956-03-18, 66 y.o., female Today's Date: 08/26/2022  PCP: Merri Brunette MD REFERRING PROVIDER: Dr. Margaretha Sheffield  END OF SESSION:   PT End of Session - 08/26/22 1108     Visit Number 2    Number of Visits 8    Date for PT Re-Evaluation 10/13/22    Authorization Type Medicare, Medicaid    PT Start Time 1106    PT Stop Time 1146    PT Time Calculation (min) 40 min    Activity Tolerance Patient tolerated treatment well    Behavior During Therapy WFL for tasks assessed/performed             Past Medical History:  Diagnosis Date   Arthritis    Hyperlipidemia    Swelling in right armpit    Past Surgical History:  Procedure Laterality Date   ABDOMINAL HYSTERECTOMY     Patient Active Problem List   Diagnosis Date Noted   Carpal tunnel syndrome, right 04/14/2020   Carpal tunnel syndrome, left 04/14/2020   Trigger thumb of right hand 04/14/2020   Achilles tendon injury, left, subsequent encounter 09/26/2019    REFERRING DIAG: M25.562 (ICD-10-CM) - Acute pain of left knee   THERAPY DIAG:  Acute pain of left knee  Difficulty in walking, not elsewhere classified  Rationale for Evaluation and Treatment Rehabilitation  PERTINENT HISTORY: See above   PRECAUTIONS: no   SUBJECTIVE: No pain today.  The exercises increase L patellar pain a bit.  Still weird when I kneel on it. I have stopped "babying" it a little bit during Pilates class   PAIN:  Are you having pain? No   OBJECTIVE: (objective measures completed at initial evaluation unless otherwise dated) DIAGNOSTIC FINDINGS: Mild degenerative joint changes of left knee.   PATIENT SURVEYS:  FOTO 77%   COGNITION:           Overall cognitive status: Within functional limits for tasks assessed                          SENSATION: WFL   EDEMA:  none   MUSCLE LENGTH: Hamstrings: Right 18 deg; Left 28 deg     POSTURE: No  Significant postural limitations   PALPATION: No pain with patellar mobilization or medial/lateral joint line Pain with compression to patella   LOWER EXTREMITY ROM:   Active ROM Right eval Left eval  Hip flexion      Hip extension      Hip abduction      Hip adduction      Hip internal rotation      Hip external rotation      Knee flexion 140 140 min pain with overpressure   Knee extension 0 2  Ankle dorsiflexion      Ankle plantarflexion      Ankle inversion      Ankle eversion       (Blank rows = not tested)   LOWER EXTREMITY MMT:   MMT Right eval Left eval  Hip flexion 5 5  Hip extension 5 5  Hip abduction 5 5, 4/5 glute med   Hip adduction      Hip internal rotation      Hip external rotation      Knee flexion 5 5  Knee extension 5 5  Ankle dorsiflexion      Ankle plantarflexion      Ankle  inversion      Ankle eversion       (Blank rows = not tested)   FUNCTIONAL TESTS:  5 times sit to stand: 8 sec                       Step up no pain, audible crepitus                       SLS 30 sec bilateral                       Lunge with L knee discomfort when forward   GAIT: Distance walked: 150 Assistive device utilized: None Level of assistance: Complete Independence Comments: no deviations        [facility] Adult PT Treatment:                                                DATE: 08/26/22 Therapeutic Exercise: Bike 5 min L3  3 way hip stretch LLE (encouraged to do the Rt leg at home as well)  Bridging red band for outer hip x 10, articulation Bridge with bent knee fall out x 10 red band, alternating, tactile cues needed Supine Bridge with Resistance Band   x 10 red band today (has blue at home)   Banded knee extension with red band 2 x 15 (modified from blue) done bilaterally for comparison, can notice a drastic difference (L crepitus and fatigue) Sit to stand with band (blue) Lateral band walks blue , min glute fatigue Glider lunge at wall for glutes x 10  each  Wall squat 1 min  Manual therapy: Taped L knee for quad support 2 Y's/  KT tape   TODAY'S TREATMENT: PT eval, HEP, discussion of Pilates and anatomy, muscle hypertrophy    PATIENT EDUCATION:   Education details: see above Person educated: Patient Education method: Explanation, Demonstration, Verbal cues, and Handouts Education comprehension: verbalized understanding and returned demonstration     HOME EXERCISE PROGRAM: Access Code: [email or web address] URL: [email or web address] Date: 08/18/2022 Prepared by: Karie Mainland   Exercises - Supine Bridge with Resistance Band  - 1 x daily - 7 x weekly - 2 sets - 10 reps - 10 hold - Wall Quarter Squat  - 1 x daily - 7 x weekly - 1 sets - 5 reps - 30 hold - Supine Hamstring Stretch with Strap  - 1 x daily - 7 x weekly - 1 sets - 5 reps - 30 hold - Supine ITB Stretch with Strap  - 1 x daily - 7 x weekly - 1 sets - 5 reps - 30 hold - Standing Quadriceps Stretch  - 1 x daily - 7 x weekly - 1 sets - 5 reps - 30 hold   ASSESSMENT:   CLINICAL IMPRESSION: Modified HEP with lighter band and added glute challenges in bridge position.  She can do all exercises with ease  Feels a definite difference in strength with banded knee ex and glide lunge (glutes). Trial of tape did not make much of a difference with exercises (done prior).    OBJECTIVE IMPAIRMENTS decreased mobility, difficulty walking, decreased strength, hypomobility, impaired flexibility, and pain.    ACTIVITY LIMITATIONS standing, squatting, and stairs   PARTICIPATION LIMITATIONS: interpersonal relationship and community activity   PERSONAL FACTORS 1 comorbidity:  previous musculoskeletal injury  are also affecting patient's functional outcome.    REHAB POTENTIAL: Excellent   CLINICAL DECISION MAKING: Stable/uncomplicated   EVALUATION COMPLEXITY: Low     GOALS:   LONG TERM GOALS: Target date: 09/29/2022    Pt will be able to negotiate stairs without feelings of  instability, weakness or pain  Baseline:  Goal status: INITIAL   2.  Pt will increase FOTO score to > 71% Baseline:  Goal status: INITIAL   3.  Pt will be able to squat, lift without knee pain  Baseline:  Goal status: INITIAL   4.  Pt will be I with HEP for strengthening hips, core, knee Baseline:  Goal status: INITIAL       PLAN: PT FREQUENCY: 1x/week   PT DURATION: 6 weeks   PLANNED INTERVENTIONS: Therapeutic exercises, Therapeutic activity, Neuromuscular re-education, Balance training, Gait training, Patient/Family education, Self Care, Joint mobilization, Cryotherapy, Moist heat, Taping, Manual therapy, and Re-evaluation   PLAN FOR NEXT SESSION: check HEP, step downs.  Reformer?      Lisha Vitale, PT 08/26/2022, 12:41 PM    Karie Mainland, PT 08/26/22 12:47 PM Phone: [phone] Fax: [phone]

## 2022-08-26 ENCOUNTER — Encounter: Payer: Self-pay | Admitting: Physical Therapy

## 2022-08-26 ENCOUNTER — Ambulatory Visit: Payer: Medicare PPO | Attending: Sports Medicine | Admitting: Physical Therapy

## 2022-08-26 DIAGNOSIS — R262 Difficulty in walking, not elsewhere classified: Secondary | ICD-10-CM | POA: Insufficient documentation

## 2022-08-26 DIAGNOSIS — M25562 Pain in left knee: Secondary | ICD-10-CM | POA: Diagnosis present

## 2022-08-30 NOTE — Therapy (Unsigned)
OUTPATIENT PHYSICAL THERAPY TREATMENT NOTE   Patient Name: Lori Mata MRN: [id number] DOB:1956/02/22, 66 y.o., female Today's Date: 08/31/2022  PCP: Merri Brunette MD REFERRING PROVIDER: Dr. Margaretha Sheffield  END OF SESSION:   PT End of Session - 08/31/22 0929     Visit Number 3    Number of Visits 8    Date for PT Re-Evaluation 10/13/22    Authorization Type Medicare, Medicaid    PT Start Time 0932    PT Stop Time 1015    PT Time Calculation (min) 43 min              Past Medical History:  Diagnosis Date   Arthritis    Hyperlipidemia    Swelling in right armpit    Past Surgical History:  Procedure Laterality Date   ABDOMINAL HYSTERECTOMY     Patient Active Problem List   Diagnosis Date Noted   Carpal tunnel syndrome, right 04/14/2020   Carpal tunnel syndrome, left 04/14/2020   Trigger thumb of right hand 04/14/2020   Achilles tendon injury, left, subsequent encounter 09/26/2019    REFERRING DIAG: M25.562 (ICD-10-CM) - Acute pain of left knee   THERAPY DIAG:  Acute pain of left knee  Difficulty in walking, not elsewhere classified  Rationale for Evaluation and Treatment Rehabilitation  PERTINENT HISTORY: See above   PRECAUTIONS: no   SUBJECTIVE: I'm feeling a little less of that popping and snapping in my knee.  Especially with Pilates .  Less pain when kneeling, can still do with a pad.    PAIN:  Are you having pain? No   OBJECTIVE: (objective measures completed at initial evaluation unless otherwise dated) DIAGNOSTIC FINDINGS: Mild degenerative joint changes of left knee.   PATIENT SURVEYS:  FOTO 77%   COGNITION:           Overall cognitive status: Within functional limits for tasks assessed                          SENSATION: WFL   EDEMA:  none   MUSCLE LENGTH: Hamstrings: Right 18 deg; Left 28 deg     POSTURE: No Significant postural limitations   PALPATION: No pain with patellar mobilization or medial/lateral joint line Pain with  compression to patella   LOWER EXTREMITY ROM:   Active ROM Right eval Left eval  Hip flexion      Hip extension      Hip abduction      Hip adduction      Hip internal rotation      Hip external rotation      Knee flexion 140 140 min pain with overpressure   Knee extension 0 2  Ankle dorsiflexion      Ankle plantarflexion      Ankle inversion      Ankle eversion       (Blank rows = not tested)   LOWER EXTREMITY MMT:   MMT Right eval Left eval  Hip flexion 5 5  Hip extension 5 5  Hip abduction 5 5, 4/5 glute med   Hip adduction      Hip internal rotation      Hip external rotation      Knee flexion 5 5  Knee extension 5 5  Ankle dorsiflexion      Ankle plantarflexion      Ankle inversion      Ankle eversion       (Blank rows = not tested)  FUNCTIONAL TESTS:  5 times sit to stand: 8 sec                       Step up no pain, audible crepitus                       SLS 30 sec bilateral                       Lunge with L knee discomfort when forward   GAIT: Distance walked: 150 Assistive device utilized: None Level of assistance: Complete Independence Comments: no deviations     [facility] Adult PT Treatment:                                                DATE: 08/31/22 Therapeutic Exercise: Pilates Reformer used for LE/core strength, postural strength, lumbopelvic disassociation and core control.  Exercises included: Footwork 2 red 1 blue 1 yellow Heels with blue band for outer hip activation (parallel) V (heels)  Toes added isometric mid range hold with alt. Knee lift  Single leg 2 green 1 red x 15  Hip circles   Bridging 2 red 1 blue with band   5 reps added unilateral clam , 2 sets  LLE weakness  5 reps, single leg , much different (weaker( on LLE   Feet in Straps 1 red single leg sidelying Hip abduction  Sidekick series (hip flex/ext, leg straight then bent)  Small circles each LE each direction x 10  Hip stretching using opposite strap (ant hip,  hamstring, outer and inner thigh)    Manual Therapy: McConnell tape 1 strip, pulling patella medially    [facility] Adult PT Treatment:                                                DATE: 08/26/22 Therapeutic Exercise: Bike 5 min L3  3 way hip stretch LLE (encouraged to do the Rt leg at home as well)  Bridging red band for outer hip x 10, articulation Bridge with bent knee fall out x 10 red band, alternating, tactile cues needed Supine Bridge with Resistance Band   x 10 red band today (has blue at home)   Banded knee extension with red band 2 x 15 (modified from blue) done bilaterally for comparison, can notice a drastic difference (L crepitus and fatigue) Sit to stand with band (blue) Lateral band walks blue , min glute fatigue Glider lunge at wall for glutes x 10 each  Wall squat 1 min  Manual therapy: Taped L knee for quad support 2 Y's/  KT tape   TODAY'S TREATMENT: PT eval, HEP, discussion of Pilates and anatomy, muscle hypertrophy    PATIENT EDUCATION:   Education details: see above Person educated: Patient Education method: Programmer, multimedia, Demonstration, Verbal cues, and Handouts Education comprehension: verbalized understanding and returned demonstration     HOME EXERCISE PROGRAM: Access Code: [email or web address] URL: [email or web address] Date: 08/18/2022 Prepared by: Karie Mainland   Exercises - Supine Bridge with Resistance Band  - 1 x daily - 7 x weekly - 2 sets - 10 reps - 10 hold - Wall Quarter Squat  - 1 x daily -  7 x weekly - 1 sets - 5 reps - 30 hold - Supine Hamstring Stretch with Strap  - 1 x daily - 7 x weekly - 1 sets - 5 reps - 30 hold - Supine ITB Stretch with Strap  - 1 x daily - 7 x weekly - 1 sets - 5 reps - 30 hold - Standing Quadriceps Stretch  - 1 x daily - 7 x weekly - 1 sets - 5 reps - 30 hold   ASSESSMENT:   CLINICAL IMPRESSION: Patient is reporting less symptoms with her knee during kneeling and workouts.  Tape seemed to help support her knee,  tried Charlton Haws today given her patellar movement today.  Able to note the differences in flexibility here Rt TFL tighter than L. ,  L quads tighter than Rt. Cont POC in rder to maximize hip strength.  More quad rolling, stretching needed. Consider DN?     OBJECTIVE IMPAIRMENTS decreased mobility, difficulty walking, decreased strength, hypomobility, impaired flexibility, and pain.    ACTIVITY LIMITATIONS standing, squatting, and stairs   PARTICIPATION LIMITATIONS: interpersonal relationship and community activity   PERSONAL FACTORS 1 comorbidity: previous musculoskeletal injury  are also affecting patient's functional outcome.    REHAB POTENTIAL: Excellent   CLINICAL DECISION MAKING: Stable/uncomplicated   EVALUATION COMPLEXITY: Low     GOALS:   LONG TERM GOALS: Target date: 09/29/2022    Pt will be able to negotiate stairs without feelings of instability, weakness or pain  Baseline:  Goal status: INITIAL   2.  Pt will increase FOTO score to > 71% Baseline:  Goal status: INITIAL   3.  Pt will be able to squat, lift without knee pain  Baseline:  Goal status: INITIAL   4.  Pt will be I with HEP for strengthening hips, core, knee Baseline:  Goal status: INITIAL       PLAN: PT FREQUENCY: 1x/week   PT DURATION: 6 weeks   PLANNED INTERVENTIONS: Therapeutic exercises, Therapeutic activity, Neuromuscular re-education, Balance training, Gait training, Patient/Family education, Self Care, Joint mobilization, Cryotherapy, Moist heat, Taping, Manual therapy, and Re-evaluation   PLAN FOR NEXT SESSION: check HEP, step downs.  Reformer? Manual to L quads       Caelan Atchley, PT 08/31/2022, 10:16 AM    Karie Mainland, PT 08/31/22 10:16 AM Phone: [phone] Fax: [phone]

## 2022-08-31 ENCOUNTER — Encounter: Payer: Self-pay | Admitting: Physical Therapy

## 2022-08-31 ENCOUNTER — Ambulatory Visit: Payer: Medicare PPO | Admitting: Physical Therapy

## 2022-08-31 DIAGNOSIS — R262 Difficulty in walking, not elsewhere classified: Secondary | ICD-10-CM

## 2022-08-31 DIAGNOSIS — M25562 Pain in left knee: Secondary | ICD-10-CM

## 2022-09-06 ENCOUNTER — Ambulatory Visit: Payer: Medicare PPO | Admitting: Sports Medicine

## 2022-09-06 VITALS — BP 114/70 | Ht 67.0 in

## 2022-09-06 DIAGNOSIS — M25562 Pain in left knee: Secondary | ICD-10-CM | POA: Diagnosis not present

## 2022-09-06 NOTE — Progress Notes (Signed)
   Subjective:    Patient ID: Lori Mata, female    DOB: 02-27-1956, 66 y.o.   MRN: [id number]  HPI  Lori Mata presents today for follow-up on left knee pain.  She has noticed some improvement working with Lori Mata.  Biggest change in her symptoms has been decreased pain when kneeling on her knee and Pilates.  Recent x-rays of the knee did show some mild patellofemoral DJD but the femorotibial space was unremarkable.  Review of Systems As above    Objective:   Physical Exam  Well-developed, well-nourished.  No acute distress  Left knee: Good range of motion.  No effusion.  There is less patellofemoral crepitus on exam today.  Neurovascular intact distally.  X-rays of the left knee as above      Assessment & Plan:   Left knee pain secondary to mild patellofemoral DJD  I reviewed Lori Mata's x-rays with her in the office.  Would like for her to continue to work in physical therapy until they discharged her to a home exercise program.  She may try over-the-counter topical Voltaren as needed.  I think she has a good understanding of what her diagnosis is and I have encouraged her to continue to stay active in Pilates.  She will follow-up with me for ongoing or recalcitrant issues.  This note was dictated using Dragon naturally speaking software and may contain errors in syntax, spelling, or content which have not been identified prior to signing this note.

## 2022-09-08 ENCOUNTER — Encounter: Payer: Self-pay | Admitting: Physical Therapy

## 2022-09-08 ENCOUNTER — Ambulatory Visit: Payer: Medicare PPO | Admitting: Physical Therapy

## 2022-09-08 DIAGNOSIS — M25562 Pain in left knee: Secondary | ICD-10-CM

## 2022-09-08 DIAGNOSIS — R262 Difficulty in walking, not elsewhere classified: Secondary | ICD-10-CM

## 2022-09-08 NOTE — Therapy (Signed)
OUTPATIENT PHYSICAL THERAPY TREATMENT NOTE   Patient Name: Lori Mata MRN: [id number] DOB:09/25/1956, 66 y.o., female Today's Date: 09/08/2022  PCP: Merri BrunettePharr, Walter MD REFERRING PROVIDER: Dr. Margaretha Sheffieldraper  END OF SESSION:   PT End of Session - 09/08/22 0844     Visit Number 4    Number of Visits 8    Date for PT Re-Evaluation 10/13/22    Authorization Type Medicare, Medicaid    PT Start Time [phone]    PT Stop Time 0930    PT Time Calculation (min) 48 min    Activity Tolerance Patient tolerated treatment well    [facility]               Past Medical History:  Diagnosis Date   Arthritis    Hyperlipidemia    Swelling in right armpit    Past Surgical History:  Procedure Laterality Date   ABDOMINAL HYSTERECTOMY     Patient Active Problem List   Diagnosis Date Noted   Carpal tunnel syndrome, right 04/14/2020   Carpal tunnel syndrome, left 04/14/2020   Trigger thumb of right hand 04/14/2020   Achilles tendon injury, left, subsequent encounter 09/26/2019    REFERRING DIAG: M25.562 (ICD-10-CM) - Acute pain of left knee   THERAPY DIAG:  Acute pain of left knee  Difficulty in walking, not elsewhere classified  Rationale for Evaluation and Treatment Rehabilitation  PERTINENT HISTORY: See above   PRECAUTIONS: no   SUBJECTIVE:   I honeetly think it is helping.  She liked the KT tape more than the McConnell tape. No pain.   PAIN:  Are you having pain? No   OBJECTIVE: (objective measures completed at initial evaluation unless otherwise dated) DIAGNOSTIC FINDINGS: Mild degenerative joint changes of left knee.   PATIENT SURVEYS:  FOTO 77%   COGNITION:           Overall cognitive status: Within functional limits for tasks assessed                          SENSATION: WFL   EDEMA:  none   MUSCLE LENGTH: Hamstrings: Right 18 deg; Left 28 deg     POSTURE: No Significant postural limitations   PALPATION: No pain  with patellar mobilization or medial/lateral joint line Pain with compression to patella   LOWER EXTREMITY ROM:   Active ROM Right eval Left eval  Hip flexion      Hip extension      Hip abduction      Hip adduction      Hip internal rotation      Hip external rotation      Knee flexion 140 140 min pain with overpressure   Knee extension 0 2  Ankle dorsiflexion      Ankle plantarflexion      Ankle inversion      Ankle eversion       (Blank rows = not tested)   LOWER EXTREMITY MMT:   MMT Right eval Left eval  Hip flexion 5 5  Hip extension 5 5  Hip abduction 5 5, 4/5 glute med   Hip adduction      Hip internal rotation      Hip external rotation      Knee flexion 5 5  Knee extension 5 5  Ankle dorsiflexion      Ankle plantarflexion      Ankle inversion      Ankle eversion       (  Blank rows = not tested)   FUNCTIONAL TESTS:  5 times sit to stand: 8 sec                       Step up no pain, audible crepitus                       SLS 30 sec bilateral                       Lunge with L knee discomfort when forward   GAIT: Distance walked: 150 Assistive device utilized: None Level of assistance: Complete Independence Comments: no deviations     [facility] Adult PT Treatment:                                                DATE: 09/08/22 Therapeutic Exercise: Elliptical ramp 2-3, resistance 3 for 5 min (last minute in reverse)  Step ups (forward, lateral and reverse) 8 inch , used 10 lbs KB total of 5 sets  Reverse step downs (more glute focused)  Lateral band walking black band 25 feet Hip hinge black band x 10  Standing springboard hip hinge single leg x 8 each side, added double shoulder pull (extension ) x 10 each (yellow spring) Facing out SLS with shoulder press x 10 , fly x 10 , done bilaterally (slastix) Sidefacing palloff press (slastix) and rotation  Freemotion hip extension with ankle strap (DF assist ) 2 plates ,   x 2 sets and x 12  Manual  therapy: Taped L knee for quad support 2 Y's/  KT tape  Patient videotaped this in private room for her to recreate at home.     [facility] Adult PT Treatment:                                                DATE: 08/31/22 Therapeutic Exercise: Pilates Reformer used for LE/core strength, postural strength, lumbopelvic disassociation and core control.  Exercises included: Footwork 2 red 1 blue 1 yellow Heels with blue band for outer hip activation (parallel) V (heels)  Toes added isometric mid range hold with alt. Knee lift  Single leg 2 green 1 red x 15  Hip circles   Bridging 2 red 1 blue with band   5 reps added unilateral clam , 2 sets  LLE weakness  5 reps, single leg , much different (weaker( on LLE   Feet in Straps 1 red single leg sidelying Hip abduction  Sidekick series (hip flex/ext, leg straight then bent)  Small circles each LE each direction x 10  Hip stretching using opposite strap (ant hip, hamstring, outer and inner thigh)    Manual Therapy: McConnell tape 1 strip, pulling patella medially    [facility] Adult PT Treatment:                                                DATE: 08/26/22 Therapeutic Exercise: Bike 5 min L3  3 way hip stretch LLE (encouraged to do the Rt leg at  home as well)  Bridging red band for outer hip x 10, articulation Bridge with bent knee fall out x 10 red band, alternating, tactile cues needed Supine Bridge with Resistance Band   x 10 red band today (has blue at home)   Banded knee extension with red band 2 x 15 (modified from blue) done bilaterally for comparison, can notice a drastic difference (L crepitus and fatigue) Sit to stand with band (blue) Lateral band walks blue , min glute fatigue Glider lunge at wall for glutes x 10 each  Wall squat 1 min  Manual therapy: Taped L knee for quad support 2 Y's/  KT tape   TODAY'S TREATMENT: PT eval, HEP, discussion of Pilates and anatomy, muscle hypertrophy    PATIENT EDUCATION:   Education details: see  above Person educated: Patient Education method: Explanation, Demonstration, Verbal cues, and Handouts Education comprehension: verbalized understanding and returned demonstration     HOME EXERCISE PROGRAM: Access Code: [email or web address] URL: [email or web address] Date: 08/18/2022 Prepared by: Karie Mainland   Exercises - Supine Bridge with Resistance Band  - 1 x daily - 7 x weekly - 2 sets - 10 reps - 10 hold - Wall Quarter Squat  - 1 x daily - 7 x weekly - 1 sets - 5 reps - 30 hold - Supine Hamstring Stretch with Strap  - 1 x daily - 7 x weekly - 1 sets - 5 reps - 30 hold - Supine ITB Stretch with Strap  - 1 x daily - 7 x weekly - 1 sets - 5 reps - 30 hold - Standing Quadriceps Stretch  - 1 x daily - 7 x weekly - 1 sets - 5 reps - 30 hold   ASSESSMENT:   CLINICAL IMPRESSION: Patient continues to repeort less clicking and popping in her knee with workouts including kneeling.  She Will benefit from 1 more visit to see if she can tape her knee as I have been  as well as to ensure good form with HEP, adding appropriate load and principles to her workouts. She needs very little cues for maintaining knee control and level pelvis.     OBJECTIVE IMPAIRMENTS decreased mobility, difficulty walking, decreased strength, hypomobility, impaired flexibility, and pain.    ACTIVITY LIMITATIONS standing, squatting, and stairs   PARTICIPATION LIMITATIONS: interpersonal relationship and community activity   PERSONAL FACTORS 1 comorbidity: previous musculoskeletal injury  are also affecting patient's functional outcome.    REHAB POTENTIAL: Excellent   CLINICAL DECISION MAKING: Stable/uncomplicated   EVALUATION COMPLEXITY: Low     GOALS:   LONG TERM GOALS: Target date: 09/29/2022    Pt will be able to negotiate stairs without feelings of instability, weakness or pain  Baseline:  Goal status: INITIAL   2.  Pt will increase FOTO score to > 71% Baseline:  Goal status: INITIAL   3.  Pt  will be able to squat, lift without knee pain  Baseline:  Goal status: INITIAL   4.  Pt will be I with HEP for strengthening hips, core, knee Baseline:  Goal status: INITIAL       PLAN: PT FREQUENCY: 1x/week   PT DURATION: 6 weeks   PLANNED INTERVENTIONS: Therapeutic exercises, Therapeutic activity, Neuromuscular re-education, Balance training, Gait training, Patient/Family education, Self Care, Joint mobilization, Cryotherapy, Moist heat, Taping, Manual therapy, and Re-evaluation   PLAN FOR NEXT SESSION: DC. Check final HEP, FOTO. Can she tape her own knee? step downs.   Manual to L quads  Raeford Razor, PT 09/08/22 9:28 AM Phone: [phone] Fax: [phone]

## 2022-09-14 NOTE — Therapy (Unsigned)
OUTPATIENT PHYSICAL THERAPY TREATMENT NOTE DISCHARGE   Patient Name: Lori Mata MRN: [id number] DOB:1956-05-13, 66 y.o., female Today's Date: 09/15/2022  PCP: Merri Brunette MD REFERRING PROVIDER: Dr. Margaretha Sheffield  END OF SESSION:   PT End of Session - 09/15/22 0844     Visit Number 5    Number of Visits 8    Date for PT Re-Evaluation 10/13/22    Authorization Type Medicare, Medicaid    PT Start Time [phone]    PT Stop Time 0915    PT Time Calculation (min) 32 min    Activity Tolerance Patient tolerated treatment well    Behavior During Therapy [facility] for tasks assessed/performed                Past Medical History:  Diagnosis Date   Arthritis    Hyperlipidemia    Swelling in right armpit    Past Surgical History:  Procedure Laterality Date   ABDOMINAL HYSTERECTOMY     Patient Active Problem List   Diagnosis Date Noted   Carpal tunnel syndrome, right 04/14/2020   Carpal tunnel syndrome, left 04/14/2020   Trigger thumb of right hand 04/14/2020   Achilles tendon injury, left, subsequent encounter 09/26/2019    REFERRING DIAG: M25.562 (ICD-10-CM) - Acute pain of left knee   THERAPY DIAG:  Acute pain of left knee  Difficulty in walking, not elsewhere classified  Rationale for Evaluation and Treatment Rehabilitation  PERTINENT HISTORY: See above   PRECAUTIONS: no   SUBJECTIVE:   I honestly think it is helping.  She liked the KT tape more than the McConnell tape. No pain.   PAIN:  Are you having pain? No   OBJECTIVE: (objective measures completed at initial evaluation unless otherwise dated) DIAGNOSTIC FINDINGS: Mild degenerative joint changes of left knee.   PATIENT SURVEYS:  FOTO 77%   COGNITION:           Overall cognitive status: Within functional limits for tasks assessed                          SENSATION: WFL   EDEMA:  none   MUSCLE LENGTH: Hamstrings: Right 18 deg; Left 28 deg     POSTURE: No Significant postural limitations    PALPATION: No pain with patellar mobilization or medial/lateral joint line Pain with compression to patella   LOWER EXTREMITY ROM:   Active ROM Right eval Left eval  Hip flexion      Hip extension      Hip abduction      Hip adduction      Hip internal rotation      Hip external rotation      Knee flexion 140 140 min pain with overpressure   Knee extension 0 2  Ankle dorsiflexion      Ankle plantarflexion      Ankle inversion      Ankle eversion       (Blank rows = not tested)   LOWER EXTREMITY MMT:   MMT Right eval Left eval Lt.  09/15/22  Hip flexion 5 5   Hip extension 5 5   Hip abduction 5 5, 4/5 glute med  4+/5 in glute med, 5/5 abd   Hip adduction       Hip internal rotation       Hip external rotation       Knee flexion 5 5   Knee extension 5 5   Ankle dorsiflexion  Ankle plantarflexion       Ankle inversion       Ankle eversion        (Blank rows = not tested)   FUNCTIONAL TESTS:  5 times sit to stand: 8 sec                       Step up no pain, audible crepitus                       SLS 30 sec bilateral                       Lunge with L knee discomfort when forward   GAIT: Distance walked: 150 Assistive device utilized: None Level of assistance: Complete Independence Comments: no deviations    [facility] Adult PT Treatment:                                                DATE: 09/14/22 Therapeutic Exercise: Elliptical L 6 for 6 min  Quad stretch standing x 2 each LE  Sidelying clam (2 x 15)  1 set in diamond position with blue band  Hip abduction blue band  Bridge with band hold 5 sec   15  Single leg bridge blue band x 10 each  ITB LLE and hamstring stretch  Self Care: POC, DC, goals, tape    [facility] Adult PT Treatment:                                                DATE: 09/08/22 Therapeutic Exercise: Elliptical ramp 2-3, resistance 3 for 5 min (last minute in reverse)  Step ups (forward, lateral and reverse) 8 inch , used 10 lbs KB total  of 5 sets  Reverse step downs (more glute focused)  Lateral band walking black band 25 feet Hip hinge black band x 10  Standing springboard hip hinge single leg x 8 each side, added double shoulder pull (extension ) x 10 each (yellow spring) Facing out SLS with shoulder press x 10 , fly x 10 , done bilaterally (slastix) Sidefacing palloff press (slastix) and rotation  Freemotion hip extension with ankle strap (DF assist ) 2 plates ,   x 2 sets and x 12  Manual therapy: Taped L knee for quad support 2 Y's/  KT tape  Patient videotaped this in private room for her to recreate at home.     [facility] Adult PT Treatment:                                                DATE: 08/31/22 Therapeutic Exercise: Pilates Reformer used for LE/core strength, postural strength, lumbopelvic disassociation and core control.  Exercises included: Footwork 2 red 1 blue 1 yellow Heels with blue band for outer hip activation (parallel) V (heels)  Toes added isometric mid range hold with alt. Knee lift  Single leg 2 green 1 red x 15  Hip circles   Bridging 2 red 1 blue with band   5 reps added unilateral  clam , 2 sets  LLE weakness  5 reps, single leg , much different (weaker( on LLE   Feet in Straps 1 red single leg sidelying Hip abduction  Sidekick series (hip flex/ext, leg straight then bent)  Small circles each LE each direction x 10  Hip stretching using opposite strap (ant hip, hamstring, outer and inner thigh)    Manual Therapy: McConnell tape 1 strip, pulling patella medially    [facility] Adult PT Treatment:                                                DATE: 08/26/22 Therapeutic Exercise: Bike 5 min L3  3 way hip stretch LLE (encouraged to do the Rt leg at home as well)  Bridging red band for outer hip x 10, articulation Bridge with bent knee fall out x 10 red band, alternating, tactile cues needed Supine Bridge with Resistance Band   x 10 red band today (has blue at home)   Banded knee extension with  red band 2 x 15 (modified from blue) done bilaterally for comparison, can notice a drastic difference (L crepitus and fatigue) Sit to stand with band (blue) Lateral band walks blue , min glute fatigue Glider lunge at wall for glutes x 10 each  Wall squat 1 min  Manual therapy: Taped L knee for quad support 2 Y's/  KT tape   TODAY'S TREATMENT: PT eval, HEP, discussion of Pilates and anatomy, muscle hypertrophy    PATIENT EDUCATION:   Education details: see above Person educated: Patient Education method: Explanation, Demonstration, Verbal cues, and Handouts Education comprehension: verbalized understanding and returned demonstration     HOME EXERCISE PROGRAM: Access Code: [email or web address] URL: [email or web address] Date: 08/18/2022 Prepared by: Raeford Razor   ASSESSMENT:   CLINICAL IMPRESSION: Patient has met her goals.  She has opted to not tape today due to an event she has.  She has met her goals and the initially had is no longer an issue.  She does not feel vulnerable when walking up and down stairs, does not need to hold the rail.  DC from PT, FOTO goal met   OBJECTIVE IMPAIRMENTS decreased mobility, difficulty walking, decreased strength, hypomobility, impaired flexibility, and pain.    ACTIVITY LIMITATIONS standing, squatting, and stairs   PARTICIPATION LIMITATIONS: interpersonal relationship and community activity   PERSONAL FACTORS 1 comorbidity: previous musculoskeletal injury  are also affecting patient's functional outcome.    REHAB POTENTIAL: Excellent   CLINICAL DECISION MAKING: Stable/uncomplicated   EVALUATION COMPLEXITY: Low     GOALS:   LONG TERM GOALS: Target date: 09/29/2022    Pt will be able to negotiate stairs without feelings of instability, weakness or pain  Baseline:  Goal status: MET   2.  Pt will increase FOTO score to > 71% Baseline: DC 80% Goal status: MET   3.  Pt will be able to squat, lift without knee pain  Baseline:   Goal status:MET   4.  Pt will be I with HEP for strengthening hips, core, knee Baseline:  Goal status: MET        PLAN: PT FREQUENCY: 1x/week   PT DURATION: 6 weeks   PLANNED INTERVENTIONS: Therapeutic exercises, Therapeutic activity, Neuromuscular re-education, Balance training, Gait training, Patient/Family education, Self Care, Joint mobilization, Cryotherapy, Moist heat, Taping, Manual therapy, and Re-evaluation   PLAN FOR NEXT SESSION:  DC. Check final HEP, FOTO. Can she tape her own knee? step downs.   Manual to L quads      PHYSICAL THERAPY DISCHARGE SUMMARY  Visits from Start of Care: 5  Current functional level related to goals / functional outcomes: See above    Remaining deficits: None limiting   Education / Equipment: HEP, alignment importance of hip and core strength  Muscle building vs Pilates   Patient agrees to discharge. Patient goals were met. Patient is being discharged due to being pleased with the current functional level.   Raeford Razor, PT 09/15/22 9:21 AM Phone: [phone] Fax: [phone]

## 2022-09-15 ENCOUNTER — Encounter: Payer: Self-pay | Admitting: Physical Therapy

## 2022-09-15 ENCOUNTER — Ambulatory Visit: Payer: Medicare PPO | Admitting: Physical Therapy

## 2022-09-15 DIAGNOSIS — R262 Difficulty in walking, not elsewhere classified: Secondary | ICD-10-CM

## 2022-09-15 DIAGNOSIS — M25562 Pain in left knee: Secondary | ICD-10-CM

## 2022-10-14 ENCOUNTER — Ambulatory Visit
Admission: RE | Admit: 2022-10-14 | Discharge: 2022-10-14 | Disposition: A | Discharge status: Home / Self Care | Payer: Medicare PPO | Source: Ambulatory Visit | Attending: Internal Medicine | Admitting: Internal Medicine

## 2022-10-14 DIAGNOSIS — Z1231 Encounter for screening mammogram for malignant neoplasm of breast: Secondary | ICD-10-CM

## 2022-12-08 ENCOUNTER — Ambulatory Visit: Payer: Medicare PPO | Admitting: Sports Medicine

## 2022-12-08 VITALS — BP 118/82 | Ht 67.0 in | Wt 150.0 lb

## 2022-12-08 DIAGNOSIS — M7522 Bicipital tendinitis, left shoulder: Secondary | ICD-10-CM

## 2022-12-08 DIAGNOSIS — M7741 Metatarsalgia, right foot: Secondary | ICD-10-CM

## 2022-12-08 DIAGNOSIS — M7742 Metatarsalgia, left foot: Secondary | ICD-10-CM

## 2022-12-08 DIAGNOSIS — M7702 Medial epicondylitis, left elbow: Secondary | ICD-10-CM | POA: Diagnosis not present

## 2022-12-08 NOTE — Progress Notes (Addendum)
   Subjective:    Patient ID: Lori Mata, female    DOB: 03-07-1956, 66 y.o.   MRN: [id number]  HPI chief complaint: Left elbow and bilateral foot pain  Lori Mata presents today with a couple of different complaints.  First complaint is left elbow pain that began a couple of months ago.  She does not recall any specific injury.  She is very active and enjoys resistance training and Pilates.  She initially had pain along the medial elbow but the pain has since radiated into the antecubital fossa and into the distal biceps muscle.  No swelling.  It is a constant type of ache but not severe enough that she has to take anything for it other than occasional Advil at night.  Pain does not radiate into the forearm or into the shoulder.  No associated numbness or tingling.  She is right-hand dominant.  She is also complaining of some bilateral foot pain that she localizes to the metatarsal heads on the plantar aspect of both feet.  She has increased the amount of hiking she has done recently.  Again, no trauma.    Review of Systems As above    Objective:   Physical Exam  Well-developed, well-nourished.  No acute distress  Left elbow: Full range of motion.  No effusion.  No soft tissue swelling.  There is tenderness to palpation along the medial epicondyle and some mild reproducible pain with resisted wrist flexion and ulnar deviation.  There is also some slight tenderness to palpation at the biceps insertion onto the radial tuberosity and reproducible pain with resisted supination.  Good pulses distally.  Negative Tinel's at the cubital tunnel.  Examination of both feet shows a mild amount of callus formation near the left second metatarsal head.  She is slightly tender to palpation across the second third metatarsals on the plantar aspect of each foot.  No swelling on the dorsum of the foot.      Assessment & Plan:   Left elbow pain likely secondary to a combination of medial epicondylitis and distal  biceps tendinitis Bilateral foot pain secondary to metatarsalgia  Home exercise program for medial epicondylitis of the left elbow.  She will also modify her workouts as needed.  She will try a compression sleeve with her workouts as well.  I did discuss the possibility of formal physical therapy if symptoms persist.  She may also choose to try over-the-counter topical medications.  Voltaren has not worked well for her in the past so I recommend that she start with capsaicin.  For her bilateral metatarsalgia, we will try a simple green insert and metatarsal pads.  Follow-up as needed.  This note was dictated using Dragon naturally speaking software and may contain errors in syntax, spelling, or content which have not been identified prior to signing this note.

## 2023-01-06 ENCOUNTER — Ambulatory Visit (INDEPENDENT_AMBULATORY_CARE_PROVIDER_SITE_OTHER): Payer: Medicare PPO

## 2023-01-06 ENCOUNTER — Encounter: Payer: Self-pay | Admitting: Podiatry

## 2023-01-06 ENCOUNTER — Ambulatory Visit: Payer: Medicare PPO | Admitting: Podiatry

## 2023-01-06 DIAGNOSIS — R202 Paresthesia of skin: Secondary | ICD-10-CM

## 2023-01-06 DIAGNOSIS — R200 Anesthesia of skin: Secondary | ICD-10-CM

## 2023-01-06 DIAGNOSIS — M7752 Other enthesopathy of left foot: Secondary | ICD-10-CM | POA: Diagnosis not present

## 2023-01-06 MED ORDER — TRIAMCINOLONE ACETONIDE 10 MG/ML IJ SUSP
10.0000 mg | Freq: Once | INTRAMUSCULAR | Status: AC
  Administered 2023-01-06: 10 mg

## 2023-01-09 NOTE — Progress Notes (Signed)
Subjective:   Patient ID: Lori Mata, female   DOB: 67 y.o.   MRN: [id number]   HPI Patient states she is does a lot of hiking and she has developed a lot of discomfort in the left over right foot and the joint along with some burning and tingling pain at times.  Patient does not remember any other issues been going on around 6 months   ROS      Objective:  Physical Exam  Neurovascular data is intact inflammation discomfort with fluid buildup around the second MPJ left over right with obvious increased pressure on the joint surface.  Good digital perfusion well-oriented x 3     Assessment:  Inflammatory capsulitis of the second MPJ left over right with increased pressure against the joint surface bilateral     Plan:  H&P reviewed condition and recommended trying to be more aggressive on the left 1 which is quite a bit more sore and long-term orthotics will be of benefit.  I went ahead today and I did a proximal nerve block left I aspirated the second MPJ getting out a small amount of clear fluid and injected with 1/4 cc dexamethasone Kenalog and casted for functional orthotics which also will offload the second MPJ bilateral and reduce pressure against the metatarsal phalangeal joints bilateral  X-rays were negative for signs of arthritis or any other bone pathology appears to be soft tissue inflammation

## 2023-03-07 ENCOUNTER — Ambulatory Visit (INDEPENDENT_AMBULATORY_CARE_PROVIDER_SITE_OTHER): Payer: Medicare PPO

## 2023-03-07 DIAGNOSIS — M7752 Other enthesopathy of left foot: Secondary | ICD-10-CM

## 2023-03-07 NOTE — Progress Notes (Signed)
Patient presents today to pick up custom molded foot orthotics recommended by Dr. Paulla Dolly.   Orthotics were dispensed and fit was satisfactory. Reviewed instructions for break-in and wear. Written instructions given to patient.  Patient will follow up as needed.   [facility] - order # [id number]

## 2023-03-26 ENCOUNTER — Encounter: Payer: Self-pay | Admitting: Family Medicine

## 2023-03-29 ENCOUNTER — Ambulatory Visit: Payer: Medicare PPO | Admitting: Family Medicine

## 2023-03-29 VITALS — BP 118/80 | Ht 67.0 in | Wt 156.0 lb

## 2023-03-29 DIAGNOSIS — M7741 Metatarsalgia, right foot: Secondary | ICD-10-CM

## 2023-03-29 DIAGNOSIS — M7742 Metatarsalgia, left foot: Secondary | ICD-10-CM | POA: Diagnosis not present

## 2023-03-29 NOTE — Patient Instructions (Signed)
You have metatarsalgia. Getting the metatarsal pad in the right spot (and a large enough pad) are critical to relieving your pain. Ok to do things like icing, ibuprofen, tylenol but these won't fix the issue. Follow up with me in 1 month or as needed if you're doing well. These pads usually work for 4-6 months and have to be replaced.

## 2023-03-30 ENCOUNTER — Encounter: Payer: Self-pay | Admitting: Family Medicine

## 2023-03-30 NOTE — Progress Notes (Signed)
PCP: Merri Brunette, MD  Subjective:   HPI: Patient is a 67 y.o. female here for left foot pain.  Patient reports pain in plantar aspect of left foot primarily under 2nd metatarsal head. Feels a burning sensation here into her toe especially if she goes for a walk for over a mile. Has seen podiatry, had injection for capsulitis of 2nd MTP back in January which did not help. Tried orthotics, also sports insoles with a metatarsal pad but problem persists. No acute injury or trauma.  Past Medical History:  Diagnosis Date   Arthritis    Hyperlipidemia    Swelling in right armpit     Current Outpatient Medications on File Prior to Visit  Medication Sig Dispense Refill   rosuvastatin (CRESTOR) 10 MG tablet 1 tablet Orally Once a day     No current facility-administered medications on file prior to visit.    Past Surgical History:  Procedure Laterality Date   ABDOMINAL HYSTERECTOMY      Allergies  Allergen Reactions   Prednisone Rash    Oral steroid    BP 118/80   Ht 5\' 7"  (1.702 m)   Wt 156 lb (70.8 kg)   BMI 24.43 kg/m      02/21/2022   10:55 AM  [facility] Adult Exercise  Frequency of aerobic exercise (# of days/week) 7  Average time in minutes 20  Frequency of strengthening activities (# of days/week) 6        No data to display              Objective:  Physical Exam:  Gen: NAD, comfortable in exam room  Left foot: Splaying 2nd and 3rd digits.  Transverse arch collapse.  Callus under 2nd metatarsal head. FROM with 5/5 strength ankle.  FROM digits. Tenderness to palpation over plantar 2nd MT head. NVI distally. Negative metatarsal squeeze and no mulder's click.   Limited MSK u/s left foot:  No cortical irregularity of 2nd, 3rd metatarsals.  No mortons neuroma.  Assessment & Plan:  1. Left foot pain - 2/2 metatarsalgia. Metatarsal pad switched out for a larger size and placed in better position to support her metatarsal heads.  Icing,  ibuprofen, tylenol only if needed.  F/u in 1 month.  Discussed how to replace these.

## 2023-04-14 ENCOUNTER — Other Ambulatory Visit: Payer: Self-pay

## 2023-04-14 ENCOUNTER — Ambulatory Visit (INDEPENDENT_AMBULATORY_CARE_PROVIDER_SITE_OTHER): Payer: Self-pay | Admitting: Sports Medicine

## 2023-04-14 DIAGNOSIS — M76891 Other specified enthesopathies of right lower limb, excluding foot: Secondary | ICD-10-CM

## 2023-04-14 NOTE — Progress Notes (Unsigned)
Patient was scheduled today for shockwave therapy.  She has been having some right sided proximal hamstring pain for the past month.  She had this in the past for which shockwave therapy resolved her pain quickly.  Of note she is also been having some acute distal hamstring pain.  This pain began a couple days ago after a 10 mile hike and has slightly been improving.  She has tried ice and NSAIDs with only temporary relief.  She has some tenderness to palpation but full range of motion and strength.  Some discomfort with resisted knee flexion.  No palpable defect, bruising or ecchymosis.  Procedure: ECSWT Indications: Right hamstring strain   Procedure Details Consent: Risks of procedure as well as the alternatives and risks of each were explained to the patient.  Written consent for procedure obtained. Time Out: Verified patient identification, verified procedure, site was marked, verified correct patient position, medications/allergies/relevent history reviewed.   The lateral hamstring was targeted for Extracorporeal shockwave therapy.    Preset: MSK  Power Level: 120 Frequency: 12 Impulse/cycles: 2000 distal hamstring/1000 proximal hamstring Head size: Large   Patient tolerated procedure well without immediate complications   Patient will follow-up in 1 week for repeat treatment.  Would recommend 5-6 sessions.  Addendum:  I was the preceptor for this visit and available for immediate consultation.  Norton Blizzard MD Marrianne Mood

## 2023-04-20 ENCOUNTER — Ambulatory Visit (INDEPENDENT_AMBULATORY_CARE_PROVIDER_SITE_OTHER): Payer: Self-pay | Admitting: Family Medicine

## 2023-04-20 DIAGNOSIS — M76891 Other specified enthesopathies of right lower limb, excluding foot: Secondary | ICD-10-CM

## 2023-04-20 NOTE — Progress Notes (Signed)
Patient returns for second shockwave treatment to right hamstring. Distal hamstring area 75% better after just one treatment though proximal not much change. No new injuries.  Procedure: ECSWT Indications:  right hamstring strain/tendinopathy   Procedure Details Consent: Risks of procedure as well as the alternatives and risks of each were explained to the patient.  Written consent for procedure obtained. Time Out: Verified patient identification, verified procedure, site was marked, verified correct patient position, medications/allergies/relevent history reviewed.  The area was cleaned with alcohol swab.     The left proximal and distal right hamstring was targeted for Extracorporeal shockwave therapy.    Preset: status post muscle injury Power Level: 120 Frequency: 12 Impulse/cycles: 1000 distal, 2000 proximal hamstring tendon at insertion Head size: large   Patient tolerated procedure well without immediate complications

## 2023-04-27 ENCOUNTER — Ambulatory Visit (INDEPENDENT_AMBULATORY_CARE_PROVIDER_SITE_OTHER): Payer: Self-pay | Admitting: Sports Medicine

## 2023-04-27 VITALS — Ht 67.0 in

## 2023-04-27 DIAGNOSIS — M76891 Other specified enthesopathies of right lower limb, excluding foot: Secondary | ICD-10-CM

## 2023-04-27 NOTE — Progress Notes (Signed)
Patient ID: Lori Mata, female   DOB: 1956/02/16, 67 y.o.   MRN: [id number]  Lori Mata returns today for her third soundwave treatment to the right hamstring.  Distal hamstring tendon pain has pretty much resolved.  She would like to focus today on the proximal hamstring tendon which continues to improve.  She leaves for her hiking trip in 1 week.  Treatment performed as below.  She will follow-up again early next week for her fourth treatment prior to leaving for her trip.  Procedure: ECSWT Indications: Right hamstring tendinopathy   Procedure Details Consent: Risks of procedure as well as the alternatives and risks of each were explained to the patient.  Written consent for procedure obtained. Time Out: Verified patient identification, verified procedure, site was marked, verified correct patient position, medications/allergies/relevent history reviewed.    The proximal right hamstring tendon was targeted for Extracorporeal shockwave therapy.    Preset: Status post muscle injury Power Level: 120 Frequency: 16 Impulse/cycles: 2500 Head size: Large   Patient tolerated procedure well without immediate complications   This note was dictated using Dragon naturally speaking software and may contain errors in syntax, spelling, or content which have not been identified prior to signing this note.

## 2023-05-02 ENCOUNTER — Ambulatory Visit (INDEPENDENT_AMBULATORY_CARE_PROVIDER_SITE_OTHER): Payer: Self-pay | Admitting: Sports Medicine

## 2023-05-02 VITALS — Ht 67.0 in

## 2023-05-02 DIAGNOSIS — M76891 Other specified enthesopathies of right lower limb, excluding foot: Secondary | ICD-10-CM

## 2023-05-02 DIAGNOSIS — H2513 Age-related nuclear cataract, bilateral: Secondary | ICD-10-CM | POA: Diagnosis not present

## 2023-05-02 NOTE — Progress Notes (Signed)
Patient ID: Lori Mata, female   DOB: 11-Jun-1956, 67 y.o.   MRN: [id number]  Lori Mata presents today for her fourth soundwave treatment to the right hamstring prior to leaving for her [place] trip tomorrow.  Treatment performed as below.  She will follow-up with [place] as needed.  Procedure: ECSWT Indications: Right hamstring tendinopathy   Procedure Details Consent: Risks of procedure as well as the alternatives and risks of each were explained to the patient.  Written consent for procedure obtained. Time Out: Verified patient identification, verified procedure, site was marked, verified correct patient position, medications/allergies/relevent history reviewed.      The proximal right hamstring tendon was targeted for Extracorporeal shockwave therapy.    Preset: Status post muscle injury Power Level: 120 Frequency: 16 Impulse/cycles: 2500 Head size: Large   Patient tolerated procedure well without immediate complications    This note was dictated using Dragon naturally speaking software and may contain errors in syntax, spelling, or content which have not been identified prior to signing this note.

## 2023-05-30 DIAGNOSIS — H6992 Unspecified Eustachian tube disorder, left ear: Secondary | ICD-10-CM | POA: Diagnosis not present

## 2023-07-19 ENCOUNTER — Encounter: Payer: Self-pay | Admitting: Sports Medicine

## 2023-08-08 ENCOUNTER — Ambulatory Visit (INDEPENDENT_AMBULATORY_CARE_PROVIDER_SITE_OTHER): Payer: Self-pay | Admitting: Sports Medicine

## 2023-08-08 DIAGNOSIS — M6530 Trigger finger, unspecified finger: Secondary | ICD-10-CM

## 2023-08-08 NOTE — Progress Notes (Signed)
   Subjective:    Patient ID: Lori Mata, female    DOB: 1956/06/05, 67 y.o.   MRN: [id number]  HPI chief complaint: Right ring trigger finger  Lori Mata presents today for soundwave treatment of a right ring finger trigger finger.  She has done some research and found a nice meta-analysis that showed that soundwave treatment may be effective.  She has a history of trigger thumb which was injected with cortisone with good results.  However, the shot was painful and she would like to avoid an injection if possible.  Symptoms are intermittent and have been occurring over the past 6 months.  She is right-hand dominant.    Review of Systems As above    Objective:   Physical Exam  Well-developed, well-nourished.  No acute distress  Examination of the right hand with attention to the right ring finger shows good range of motion.  No active triggering today.  Small palpable tender nodule at the A1 pulley.      Assessment & Plan:   Right ring finger trigger finger  Soundwave treatment performed as below.  Treatment was targeted at the palmar aspect of the flexor tendon at the level of the A1 pulley.  The initial settings of 90, 300, and 10 with the use of a small head were a little too painful so we changed to the settings you see below.  She tolerated this much better.  She will follow-up next week for her second treatment.  I also discussed the use of a Band-Aid splint at the PIP joint at night to help prevent locking while she sleeps.  Procedure: ECSWT Indications: Right ring trigger finger   Procedure Details Consent: Risks of procedure as well as the alternatives and risks of each were explained to the patient.  Written consent for procedure obtained. Time Out: Verified patient identification, verified procedure, site was marked, verified correct patient position, medications/allergies/relevent history reviewed.  The area was cleaned with alcohol swab.     The right ring trigger finger was  targeted for Extracorporeal shockwave therapy.    Power Level: 40 Frequency: 1000 Impulse/cycles: 10 Head size: Medium   Patient tolerated procedure well without immediate complications    This note was dictated using Dragon naturally speaking software and may contain errors in syntax, spelling, or content which have not been identified prior to signing this note.

## 2023-08-15 ENCOUNTER — Ambulatory Visit (INDEPENDENT_AMBULATORY_CARE_PROVIDER_SITE_OTHER): Payer: Self-pay | Admitting: Sports Medicine

## 2023-08-15 DIAGNOSIS — M6530 Trigger finger, unspecified finger: Secondary | ICD-10-CM

## 2023-08-15 NOTE — Progress Notes (Signed)
Patient ID: Lori Mata, female   DOB: [birth year], 67 y.o.   MRN: [id number]  Lori Mata presents today for a second soundwave treatment for a right ring finger trigger finger.  She feels like she may have noticed some slight improvement after the first treatment.  Second treatment performed as below.  She tolerates this without difficulty.  Follow-up next week for her third treatment.  Procedure: ECSWT Indications: Trigger finger, right ring finger   Procedure Details Consent: Risks of procedure as well as the alternatives and risks of each were explained to the patient.  Written consent for procedure obtained. Time Out: Verified patient identification, verified procedure, site was marked, verified correct patient position, medications/allergies/relevent history reviewed.  The area was cleaned with alcohol swab.     The ring finger A1 pulley, right hand was targeted for Extracorporeal shockwave therapy.    Power Level: 40 Frequency: 10 Impulse/cycles: 1000 Head size: Medium   Patient tolerated procedure well without immediate complications   This note was dictated using Dragon naturally speaking software and may contain errors in syntax, spelling, or content which have not been identified prior to signing this note.

## 2023-08-22 ENCOUNTER — Ambulatory Visit (INDEPENDENT_AMBULATORY_CARE_PROVIDER_SITE_OTHER): Payer: Self-pay | Admitting: Sports Medicine

## 2023-08-22 DIAGNOSIS — M6530 Trigger finger, unspecified finger: Secondary | ICD-10-CM

## 2023-08-22 NOTE — Progress Notes (Signed)
Patient ID: Lori Mata, female   DOB: [birth year], 67 y.o.   MRN: [id number]  Lori Mata presents today for her third soundwave treatment for a right ring finger trigger finger.  Overall, she feels like she has improved by about 25%.  Third treatment performed as below.  She is on vacation for the next couple of weeks but we will plan on scheduling her fourth treatment when she returns.  Procedure: ECSWT Indications: Trigger finger   Procedure Details Consent: Risks of procedure as well as the alternatives and risks of each were explained to the patient.  Written consent for procedure obtained. Time Out: Verified patient identification, verified procedure, site was marked, verified correct patient position, medications/allergies/relevent history reviewed.  The area was cleaned with alcohol swab.     The right ring finger A1 pulley was targeted for Extracorporeal shockwave therapy.    Power Level: 40 Frequency: 10 Impulse/cycles: 1000 Head size: Medium   Patient tolerated procedure well without immediate complications    This note was dictated using Dragon naturally speaking software and may contain errors in syntax, spelling, or content which have not been identified prior to signing this note.

## 2023-09-04 ENCOUNTER — Other Ambulatory Visit: Payer: Self-pay | Admitting: Internal Medicine

## 2023-09-04 DIAGNOSIS — Z1231 Encounter for screening mammogram for malignant neoplasm of breast: Secondary | ICD-10-CM

## 2023-09-05 DIAGNOSIS — Z6825 Body mass index (BMI) 25.0-25.9, adult: Secondary | ICD-10-CM | POA: Diagnosis not present

## 2023-09-05 DIAGNOSIS — Z01419 Encounter for gynecological examination (general) (routine) without abnormal findings: Secondary | ICD-10-CM | POA: Diagnosis not present

## 2023-10-17 ENCOUNTER — Ambulatory Visit
Admission: RE | Admit: 2023-10-17 | Discharge: 2023-10-17 | Disposition: A | Discharge status: Home / Self Care | Payer: Medicare PPO | Source: Ambulatory Visit | Attending: Internal Medicine | Admitting: Internal Medicine

## 2023-10-17 DIAGNOSIS — Z1231 Encounter for screening mammogram for malignant neoplasm of breast: Secondary | ICD-10-CM

## 2023-11-08 DIAGNOSIS — L820 Inflamed seborrheic keratosis: Secondary | ICD-10-CM | POA: Diagnosis not present

## 2023-11-08 DIAGNOSIS — D485 Neoplasm of uncertain behavior of skin: Secondary | ICD-10-CM | POA: Diagnosis not present

## 2023-11-28 DIAGNOSIS — L820 Inflamed seborrheic keratosis: Secondary | ICD-10-CM | POA: Diagnosis not present

## 2023-11-28 DIAGNOSIS — L814 Other melanin hyperpigmentation: Secondary | ICD-10-CM | POA: Diagnosis not present

## 2023-11-28 DIAGNOSIS — D2262 Melanocytic nevi of left upper limb, including shoulder: Secondary | ICD-10-CM | POA: Diagnosis not present

## 2023-11-28 DIAGNOSIS — L821 Other seborrheic keratosis: Secondary | ICD-10-CM | POA: Diagnosis not present

## 2023-11-30 DIAGNOSIS — I10 Essential (primary) hypertension: Secondary | ICD-10-CM | POA: Diagnosis not present

## 2023-11-30 DIAGNOSIS — M65341 Trigger finger, right ring finger: Secondary | ICD-10-CM | POA: Diagnosis not present

## 2023-11-30 DIAGNOSIS — E782 Mixed hyperlipidemia: Secondary | ICD-10-CM | POA: Diagnosis not present

## 2023-12-05 DIAGNOSIS — K219 Gastro-esophageal reflux disease without esophagitis: Secondary | ICD-10-CM | POA: Diagnosis not present

## 2023-12-05 DIAGNOSIS — I10 Essential (primary) hypertension: Secondary | ICD-10-CM | POA: Diagnosis not present

## 2023-12-05 DIAGNOSIS — G5600 Carpal tunnel syndrome, unspecified upper limb: Secondary | ICD-10-CM | POA: Diagnosis not present

## 2023-12-05 DIAGNOSIS — R7401 Elevation of levels of liver transaminase levels: Secondary | ICD-10-CM | POA: Diagnosis not present

## 2023-12-05 DIAGNOSIS — R600 Localized edema: Secondary | ICD-10-CM | POA: Diagnosis not present

## 2023-12-05 DIAGNOSIS — J309 Allergic rhinitis, unspecified: Secondary | ICD-10-CM | POA: Diagnosis not present

## 2023-12-05 DIAGNOSIS — E782 Mixed hyperlipidemia: Secondary | ICD-10-CM | POA: Diagnosis not present

## 2023-12-05 DIAGNOSIS — R8271 Bacteriuria: Secondary | ICD-10-CM | POA: Diagnosis not present

## 2023-12-05 DIAGNOSIS — Z0000 Encounter for general adult medical examination without abnormal findings: Secondary | ICD-10-CM | POA: Diagnosis not present

## 2023-12-05 DIAGNOSIS — E559 Vitamin D deficiency, unspecified: Secondary | ICD-10-CM | POA: Diagnosis not present

## 2023-12-25 DIAGNOSIS — R053 Chronic cough: Secondary | ICD-10-CM | POA: Diagnosis not present

## 2024-02-01 DIAGNOSIS — K130 Diseases of lips: Secondary | ICD-10-CM | POA: Diagnosis not present

## 2024-02-01 DIAGNOSIS — L570 Actinic keratosis: Secondary | ICD-10-CM | POA: Diagnosis not present

## 2024-03-20 DIAGNOSIS — L570 Actinic keratosis: Secondary | ICD-10-CM | POA: Diagnosis not present

## 2024-03-25 ENCOUNTER — Ambulatory Visit: Admitting: Family Medicine

## 2024-03-25 ENCOUNTER — Encounter: Payer: Self-pay | Admitting: Family Medicine

## 2024-03-25 VITALS — BP 104/82 | Ht 67.0 in | Wt 153.0 lb

## 2024-03-25 DIAGNOSIS — M47896 Other spondylosis, lumbar region: Secondary | ICD-10-CM

## 2024-03-25 NOTE — Patient Instructions (Signed)
 Keep it up with the pilates! Your low back pain is due to arthritis. Start physical therapy for 1-3 visits to learn a more extensive home exercise program to do daily. These are the different medications you can take for this: Tylenol 500mg  1-2 tabs three times a day for pain. Voltaren gel, capsaicin, aspercreme, or biofreeze topically up to four times a day may also help with pain. Some supplements that may help for arthritis: Boswellia extract, curcumin, pycnogenol Aleve 1-2 tabs twice a day with food if needed. Follow up with me in 6 weeks.

## 2024-03-25 NOTE — Progress Notes (Signed)
 PCP: Merri Brunette, MD  Subjective:   HPI: Patient is a 68 y.o. female here for low back pain.  Patient reports she's had off and on low back pain for years. However in past several months has become more frequent. Sometimes with catching, can be sharp. Sore and uncomfortable at times. Worse sleeping, sitting for long time. Doing pilates 6x/week and wants to know if this is causing issues. Into right hip sometimes. No numbness/tingling. No bowel/bladder dysfunction.  Past Medical History:  Diagnosis Date   Arthritis    Hyperlipidemia    Swelling in right armpit     Current Outpatient Medications on File Prior to Visit  Medication Sig Dispense Refill   rosuvastatin (CRESTOR) 10 MG tablet 1 tablet Orally Once a day     No current facility-administered medications on file prior to visit.    Past Surgical History:  Procedure Laterality Date   ABDOMINAL HYSTERECTOMY      Allergies  Allergen Reactions   Prednisone Rash    Oral steroid    BP 104/82   Ht 5\' 7"  (1.702 m)   Wt 153 lb (69.4 kg)   BMI 23.96 kg/m      02/21/2022   10:55 AM  [facility] Adult Exercise  Frequency of aerobic exercise (# of days/week) 7  Average time in minutes 20  Frequency of strengthening activities (# of days/week) 6        No data to display              Objective:  Physical Exam:  Gen: NAD, comfortable in exam room  Back: No gross deformity, scoliosis. No paraspinal tenderness.  No midline or bony TTP. FROM without pain. Strength LEs 5/5 all muscle groups.   Trace MSRs in patellar and 1+ achilles tendons, equal bilaterally. Negative SLRs. Sensation intact to light touch bilaterally. Negative logroll, faber, fadir bilaterally.  Assessment & Plan:  1. Low back pain - exam reassuring.  Consistent with pain due to degenerative changes.  No red flags.  Start physical therapy with transition to home exercise program.  Tylenol, topical medications, supplements,  aleve reviewed.  Follow up in 6 weeks.

## 2024-04-02 NOTE — Therapy (Signed)
 OUTPATIENT PHYSICAL THERAPY THORACOLUMBAR EVALUATION   Patient Name: Lori Mata MRN: [id number] DOB:April 02, 1956, 68 y.o., female Today's Date: 04/04/2024  END OF SESSION:  PT End of Session - 04/04/24 1321     Visit Number 1    Number of Visits 9    Date for PT Re-Evaluation 06/07/24    Authorization Type [employer] MEDICARE CHOICE PPO    PT Start Time 1500    PT Stop Time 1545    PT Time Calculation (min) 45 min    Activity Tolerance Patient tolerated treatment well    Behavior During Therapy WFL for tasks assessed/performed             Past Medical History:  Diagnosis Date   Arthritis    Hyperlipidemia    Swelling in right armpit    Past Surgical History:  Procedure Laterality Date   ABDOMINAL HYSTERECTOMY     Patient Active Problem List   Diagnosis Date Noted   Carpal tunnel syndrome, right 04/14/2020   Carpal tunnel syndrome, left 04/14/2020   Trigger thumb of right hand 04/14/2020   Achilles tendon injury, left, subsequent encounter 09/26/2019    PCP: Imelda Man, MD   REFERRING PROVIDER: Salina Craver, MD   REFERRING DIAG: [id number] (ICD-10-CM) - Other osteoarthritis of spine, lumbar region   Rationale for Evaluation and Treatment: Rehabilitation  THERAPY DIAG:  Other low back pain  ONSET DATE: 6 months intermittently  SUBJECTIVE:                                                                                                                                                                                           SUBJECTIVE STATEMENT: Pt reports a 6 month Hx of R low back pain which recently has improved. She would like to receive PT for education and direction regarding exercises and measures to help keep her back healthy and manage the pain when it occurs. She participates in pilates 5 to 6 days a week and wonders if some of the movements and poses aggravate her low back. The pain particularly bothered her when sleeping, and sleeping with a pillow  between her knees did not seemed to help.   PERTINENT HISTORY:  Arthritis  PAIN:  Are you having pain? Yes: NPRS scale: 0/10 currently; Pain range when the last episode occurred: 0-6/10 Pain location: R low back Pain description: Ache to sharp Aggravating factors: More associated with sitting; sleeping Relieving factors: Heat and anti-immflamorry's helps minimally, lidocaine patches  PRECAUTIONS: None  RED FLAGS: None   WEIGHT BEARING RESTRICTIONS: No  FALLS:  Has patient fallen in last 6 months? No  LIVING  ENVIRONMENT: Lives with: lives with their partner Lives in: House/apartment Able to access home  OCCUPATION: Retired - pliates, walking, hiking, walking dogs  PLOF: Independent  PATIENT GOALS:  PT for education and direction regarding exercises and measures to help keep her back healthy and manage the pain when it occurs  NEXT MD VISIT: 05/20/24  OBJECTIVE:  Note: Objective measures were completed at Evaluation unless otherwise noted.  DIAGNOSTIC FINDINGS:  NA for low back  PATIENT SURVEYS:  Modified Oswestry 4/50=8%   COGNITION: Overall cognitive status: Within functional limits for tasks assessed     SENSATION: WFL  MUSCLE LENGTH: Hamstrings: Right WNLs deg; Left WNLs deg Andy Bannister test: Right WNLs deg; Left WNLs deg  POSTURE:  Winging scapula  PALPATION: Not TTP, but notes whn she does have pain it it in the area of her R SI joint  LUMBAR ROM:   AROM eval  Flexion Full, no provocation  Extension 25% limited, no provocation  Right lateral flexion 25% limited, no provocation  Left lateral flexion 25%, min pulling R low back  Right rotation Full, no provocation  Left rotation Full, no provocation   (Blank rows = not tested)  LOWER EXTREMITY ROM:     WFLS Active  Right eval Left eval  Hip flexion    Hip extension    Hip abduction    Hip adduction    Hip internal rotation    Hip external rotation    Knee flexion    Knee extension    Ankle  dorsiflexion    Ankle plantarflexion    Ankle inversion    Ankle eversion     (Blank rows = not tested)  LOWER EXTREMITY MMT:   Good LE and core strength MMT Right eval Left eval  Hip flexion    Hip extension    Hip abduction    Hip adduction    Hip internal rotation    Hip external rotation    Knee flexion    Knee extension    Ankle dorsiflexion    Ankle plantarflexion    Ankle inversion    Ankle eversion     (Blank rows = not tested)  LUMBAR SPECIAL TESTS:  Straight leg raise test: Negative, Slump test: Negative, SI Compression/distraction test: Negative, FABER test: Negative, and Long sit test: Negative  FUNCTIONAL TESTS:  NT  GAIT: Distance walked: 100'  Assistive device utilized: None Level of assistance: Complete Independence Comments: WNLs  TREATMENT DATE:  [facility] Adult PT Treatment:                                                DATE: 04/03/24 Self Care: Physiology of pain with arthritis and static positions of sitting and rest.  Sleeping positions and support for more restful sleep. H/L with a pillow or 2 under her knees. Management of activity level re: pain management Use of cold pack for pain management  PATIENT EDUCATION:  Education details: Eval findings, POC, self care  Person educated: Patient Education method: Explanation Education comprehension: verbalized understanding  HOME EXERCISE PROGRAM: TBD  ASSESSMENT:  CLINICAL IMPRESSION: Patient is a 68 y.o. female who was seen today for physical therapy evaluation and treatment for M47.896 (ICD-10-CM) - Other osteoarthritis of spine, lumbar region. Pt presents with a Hx of R LBP which has recently improved. When painful, it is usually in the R SI joint area with associated increased muscle tightness. Symptoms were not able to be provoked today. Self Care was provided as above.  Pt will benefit from PT services to continue back care education and measures to address pain, develop a HEP, and to advise re: beneficial exercises and participation level with pilates.  OBJECTIVE IMPAIRMENTS: pain and understanding of back care .   ACTIVITY LIMITATIONS: sitting and sleeping  PARTICIPATION LIMITATIONS:  recreational pursuits  PERSONAL FACTORS: Past/current experiences and Time since onset of injury/illness/exacerbation are also affecting patient's functional outcome.   REHAB POTENTIAL: Excellent  CLINICAL DECISION MAKING: Stable/uncomplicated  EVALUATION COMPLEXITY: Low   GOALS:  SHORT TERM GOALS: Target date: 04/26/24  Pt will be Ind in an initial HEP  Baseline: TBD Goal status: INITIAL  2.  Pt will voice understanding of measures to assist in pain reduction  Baseline: started Goal status: INITIAL  LONG TERM GOALS: Target date: 06/07/24  Pt will be Ind in a final HEP to maintain achieved LOF  Baseline:  Goal status: INITIAL  2.  Pt report better understanding of pilate exercises which promote a healthy back vs exercises which aggravate her low back     Baseline:  Goal status: INITIAL  3.  Pt will report 50% or greater improvement in low back pain when sleeping and quality of rest    Baseline:  Goal status: INITIAL  PLAN:  PT FREQUENCY: 1x/week  PT DURATION: 8 weeks  PLANNED INTERVENTIONS: 97164- PT Re-evaluation, 97110-Therapeutic exercises, 97530- Therapeutic activity, [id number]- Neuromuscular re-education, 97535- Self Care, 16109- Manual therapy, G0283- Electrical stimulation (unattended), 97035- Ultrasound, 60454- Traction (mechanical), [id number]- Ionotophoresis 4mg /ml Dexamethasone, Patient/Family education, Taping, Dry Needling, Joint mobilization, Spinal mobilization, Cryotherapy, and Moist heat.  PLAN FOR NEXT SESSION: Assess response to HEP; progress therex as indicated; use of modalities, manual therapy; and TPDN as indicated.   Tori Dattilio MS,  PT 04/05/24 2:58 PM  Referring diagnosis?  M47.896 (ICD-10-CM) - Other osteoarthritis of spine, lumbar region   Treatment diagnosis? (if different than referring diagnosis)  Other low back pain   What was this (referring dx) caused by? []  Surgery []  Fall [x]  Ongoing issue [x]  Arthritis []  Other: ____________  Laterality: [x]  Rt []  Lt []  Both  Check all possible CPT codes:  *CHOOSE 10 OR LESS*    See Planned Interventions listed in the Plan section of the Evaluation.

## 2024-04-03 ENCOUNTER — Ambulatory Visit: Attending: Family Medicine

## 2024-04-03 ENCOUNTER — Other Ambulatory Visit: Payer: Self-pay

## 2024-04-03 DIAGNOSIS — M5459 Other low back pain: Secondary | ICD-10-CM | POA: Diagnosis not present

## 2024-04-03 DIAGNOSIS — M47896 Other spondylosis, lumbar region: Secondary | ICD-10-CM | POA: Insufficient documentation

## 2024-04-03 DIAGNOSIS — R262 Difficulty in walking, not elsewhere classified: Secondary | ICD-10-CM | POA: Diagnosis not present

## 2024-04-03 DIAGNOSIS — M25562 Pain in left knee: Secondary | ICD-10-CM | POA: Diagnosis not present

## 2024-04-17 ENCOUNTER — Encounter: Payer: Self-pay | Admitting: Physical Therapy

## 2024-04-17 ENCOUNTER — Ambulatory Visit: Admitting: Physical Therapy

## 2024-04-17 DIAGNOSIS — L2089 Other atopic dermatitis: Secondary | ICD-10-CM | POA: Diagnosis not present

## 2024-04-17 DIAGNOSIS — M5459 Other low back pain: Secondary | ICD-10-CM

## 2024-04-17 DIAGNOSIS — M25562 Pain in left knee: Secondary | ICD-10-CM | POA: Diagnosis not present

## 2024-04-17 DIAGNOSIS — L853 Xerosis cutis: Secondary | ICD-10-CM | POA: Diagnosis not present

## 2024-04-17 DIAGNOSIS — M47896 Other spondylosis, lumbar region: Secondary | ICD-10-CM | POA: Diagnosis not present

## 2024-04-17 DIAGNOSIS — R262 Difficulty in walking, not elsewhere classified: Secondary | ICD-10-CM | POA: Diagnosis not present

## 2024-04-17 NOTE — Therapy (Addendum)
 OUTPATIENT PHYSICAL THERAPY THORACOLUMBAR NOTE  DISCHARGE   Patient Name: Lori Mata MRN: [id number] DOB:[birth year], 68 y.o., female Today's Date: 04/18/2024   Visits from Start of Care: 2  Current functional level related to goals / functional outcomes: Unknown   Remaining deficits: Unknown   Education / Equipment: Pilates    Patient agrees to discharge. Patient goals were likely met. Patient is being discharged due to not returning since the last visit.  END OF SESSION:  PT End of Session - 04/17/24 1505     Visit Number 2    Number of Visits 9    Date for PT Re-Evaluation 06/07/24    Authorization Type [employer] MEDICARE CHOICE PPO    PT Start Time 1500    PT Stop Time 1545    PT Time Calculation (min) 45 min    Activity Tolerance Patient tolerated treatment well    Behavior During Therapy WFL for tasks assessed/performed              Past Medical History:  Diagnosis Date   Arthritis    Hyperlipidemia    Swelling in right armpit    Past Surgical History:  Procedure Laterality Date   ABDOMINAL HYSTERECTOMY     Patient Active Problem List   Diagnosis Date Noted   Carpal tunnel syndrome, right 04/14/2020   Carpal tunnel syndrome, left 04/14/2020   Trigger thumb of right hand 04/14/2020   Achilles tendon injury, left, subsequent encounter 09/26/2019    PCP: Imelda Man, MD   REFERRING PROVIDER: Salina Craver, MD   REFERRING DIAG: [phone] (ICD-10-CM) - Other osteoarthritis of spine, lumbar region   Rationale for Evaluation and Treatment: Rehabilitation  THERAPY DIAG:  Other low back pain  ONSET DATE: 6 months intermittently  SUBJECTIVE:                                                                                                                                                                                           SUBJECTIVE STATEMENT:  Pt reports improved pain in her Rt hip. Sitting increases pain. Movement helps. Uses her home  Reformer.    Pt reports a 6 month Hx of R low back pain which recently has improved. She would like to receive PT for education and direction regarding exercises and measures to help keep her back healthy and manage the pain when it occurs. She participates in pilates 5 to 6 days a week and wonders if some of the movements and poses aggravate her low back. The pain particularly bothered her when sleeping, and sleeping with a pillow between her knees did not seemed to help.  PERTINENT HISTORY:  Arthritis  PAIN:  Are you having pain? Yes: NPRS scale: 0/10 currently; Pain range when the last episode occurred: 0-6/10 Pain location: R low back Pain description: Ache to sharp Aggravating factors: More associated with sitting; sleeping Relieving factors: Heat and anti-immflamorry's helps minimally, lidocaine patches  PRECAUTIONS: None  RED FLAGS: None   WEIGHT BEARING RESTRICTIONS: No  FALLS:  Has patient fallen in last 6 months? No  LIVING ENVIRONMENT: Lives with: lives with their partner Lives in: House/apartment Able to access home  OCCUPATION: Retired - pilates, walking, hiking, walking dogs  PLOF: Independent  PATIENT GOALS:  PT for education and direction regarding exercises and measures to help keep her back healthy and manage the pain when it occurs  NEXT MD VISIT: 05/20/24  OBJECTIVE:  Note: Objective measures were completed at Evaluation unless otherwise noted.  DIAGNOSTIC FINDINGS:  NA for low back  PATIENT SURVEYS:  Modified Oswestry 4/50=8%   COGNITION: Overall cognitive status: Within functional limits for tasks assessed     SENSATION: WFL  MUSCLE LENGTH: Hamstrings: Right WNLs deg; Left WNLs deg Andy Bannister test: Right WNLs deg; Left WNLs deg  POSTURE: Winging scapula  PALPATION: Not TTP, but notes whn she does have pain it it in the area of her R SI joint  LUMBAR ROM:   AROM eval  Flexion Full, no provocation  Extension 25% limited, no provocation   Right lateral flexion 25% limited, no provocation  Left lateral flexion 25%, min pulling R low back  Right rotation Full, no provocation  Left rotation Full, no provocation   (Blank rows = not tested)  LOWER EXTREMITY ROM:     WFLS Active  Right eval Left eval  Hip flexion    Hip extension    Hip abduction    Hip adduction    Hip internal rotation    Hip external rotation    Knee flexion    Knee extension    Ankle dorsiflexion    Ankle plantarflexion    Ankle inversion    Ankle eversion     (Blank rows = not tested)  LOWER EXTREMITY MMT:   Good LE and core strength MMT Right eval Left eval  Hip flexion    Hip extension     Hip abduction    Hip adduction    Hip internal rotation    Hip external rotation    Knee flexion    Knee extension    Ankle dorsiflexion    Ankle plantarflexion    Ankle inversion    Ankle eversion     (Blank rows = not tested)  LUMBAR SPECIAL TESTS:  Straight leg raise test: Negative, Slump test: Negative, SI Compression/distraction test: Negative, FABER test: Negative, and Long sit test: Negative  FUNCTIONAL TESTS:  NT  GAIT: Distance walked: 100'  Assistive device utilized: None Level of assistance: Complete Independence Comments: WNLs  TREATMENT DATE:   [facility] Adult PT Treatment:                                                DATE: 04/17/24 Neuromuscular re-ed: Pilates Reformer used for LE/core strength, postural strength, lumbopelvic disassociation and core control.  Exercises included: Footwork 2 red 1 blue 1 yellow Narrow heels and toes  Used multiple cues and positions to fact Lumbar stabilization: Clam alternating SLR Dead bug  Bridging x  10 4 springs Supine Abs/UE: 1 red 1 blue arcs x 10  Feet in Straps 1 red and Blue arcs, Frog squat and circle      [facility] Adult PT Treatment:                                                DATE: 04/03/24 Self Care: Physiology of pain with arthritis and static positions of sitting  and rest.  Sleeping positions and support for more restful sleep. H/L with a pillow or 2 under her knees. Management of activity level re: pain management Use of cold pack for pain management                                                                                                                              PATIENT EDUCATION:  Education details: Eval findings, POC, self care  Person educated: Patient Education method: Explanation Education comprehension: verbalized understanding  HOME EXERCISE PROGRAM: Access Code: [email or web address] URL: [email or web address] Date: 04/17/2024 Prepared by: Marci Setter  Exercises - Supine 90/90 Abdominal Bracing  - 1 x daily - 7 x weekly - 1 sets - 3 reps - 30 hold - Supine Dead Bug with Leg Extension  - 1 x daily - 7 x weekly - 2 sets - 10 reps - 5 hold - Pilates Bridge  - 1 x daily - 7 x weekly - 2 sets - 10 reps - 5 hold - Shoulder Bridge Prep with Marching  - 1 x daily - 7 x weekly - 2 sets - 10 reps - 5 hold - Bridge with Hip Abduction and Resistance  - 1 x daily - 7 x weekly - 2 sets - 10 reps - 5 hold ASSESSMENT:  CLINICAL IMPRESSION: Patient was checked for form with basic Pilates exercises.  Discussed categorizing exercises into flexion or extension, spine articulation vs hinge.  She loses her core connection and anteriorly rotates pelvis with hip extension.  Used multiple cues and props and heavy cues for breathing in order to facilitate proper movement patterns. She was given an HEP for core and will be out of town for 2 weeks.  Will reassess the need for further PT after that. Of note, she has a leg length discrepancy which may contribute to her Rt sided (shorter) back pain    Patient is a 68 y.o. female who was seen today for physical therapy evaluation and treatment for M47.896 (ICD-10-CM) - Other osteoarthritis of spine, lumbar region. Pt presents with a Hx of R LBP which has recently improved. When painful, it is usually in the  R SI joint area with associated increased muscle tightness. Symptoms were not able to be provoked today. Self Care was provided as above. Pt will benefit from PT services to continue back care education and measures to address pain, develop  a HEP, and to advise re: beneficial exercises and participation level with pilates.  OBJECTIVE IMPAIRMENTS: pain and understanding of back care.   ACTIVITY LIMITATIONS: sitting and sleeping  PARTICIPATION LIMITATIONS: recreational pursuits  PERSONAL FACTORS: Past/current experiences and Time since onset of injury/illness/exacerbation are also affecting patient's functional outcome.   REHAB POTENTIAL: Excellent  CLINICAL DECISION MAKING: Stable/uncomplicated  EVALUATION COMPLEXITY: Low   GOALS:  SHORT TERM GOALS: Target date: 04/26/24  Pt will be Ind in an initial HEP  Baseline: TBD Goal status:Met   2.  Pt will voice understanding of measures to assist in pain reduction  Baseline: started Goal status: INITIAL  LONG TERM GOALS: Target date: 06/07/24  Pt will be Ind in a final HEP to maintain achieved LOF  Baseline:  Goal status: INITIAL  2.  Pt report better understanding of Pilates exercises which promote a healthy back vs exercises which aggravate her low back     Baseline:  Goal status: INITIAL  3.  Pt will report 50% or greater improvement in low back pain when sleeping and quality of rest    Baseline:  Goal status: INITIAL  PLAN:  PT FREQUENCY: 1x/week  PT DURATION: 8 weeks  PLANNED INTERVENTIONS: 97164- PT Re-evaluation, 97110-Therapeutic exercises, 97530- Therapeutic activity, 97112- Neuromuscular re-education, 97535- Self Care, 16109- Manual therapy, G0283- Electrical stimulation (unattended), [id number]- Ultrasound, 60454- Traction (mechanical), [id number]- Ionotophoresis 4mg /ml Dexamethasone, Patient/Family education, Taping, Dry Needling, Joint mobilization, Spinal mobilization, Cryotherapy, and Moist heat.  PLAN FOR NEXT SESSION:  Assess response to HEP; progress therex as indicated; use of modalities, manual therapy; and TPDN as indicated.   Allen Ralls MS, PT 04/18/24 1:35 PM  Referring diagnosis?  M47.896 (ICD-10-CM) - Other osteoarthritis of spine, lumbar region   Treatment diagnosis? (if different than referring diagnosis)  Other low back pain   What was this (referring dx) caused by? []  Surgery []  Fall [x]  Ongoing issue [x]  Arthritis []  Other: ____________  Laterality: [x]  Rt []  Lt []  Both  Check all possible CPT codes:  *CHOOSE 10 OR LESS*    See Planned Interventions listed in the Plan section of the Evaluation.     Marci Setter, PT 04/18/24 1:35 PM Phone: [phone] Fax: [phone]

## 2024-04-18 ENCOUNTER — Encounter: Payer: Self-pay | Admitting: Physical Therapy

## 2024-04-22 ENCOUNTER — Ambulatory Visit: Admitting: Physical Therapy

## 2024-05-15 ENCOUNTER — Encounter

## 2024-05-15 ENCOUNTER — Ambulatory Visit: Admitting: Family Medicine

## 2024-05-15 DIAGNOSIS — H2513 Age-related nuclear cataract, bilateral: Secondary | ICD-10-CM | POA: Diagnosis not present

## 2024-05-16 ENCOUNTER — Ambulatory Visit: Admitting: Physical Therapy

## 2024-05-20 ENCOUNTER — Ambulatory Visit: Admitting: Family Medicine

## 2024-05-23 DIAGNOSIS — R519 Headache, unspecified: Secondary | ICD-10-CM | POA: Diagnosis not present

## 2024-05-23 DIAGNOSIS — M542 Cervicalgia: Secondary | ICD-10-CM | POA: Diagnosis not present

## 2024-05-30 DIAGNOSIS — M542 Cervicalgia: Secondary | ICD-10-CM | POA: Diagnosis not present

## 2024-05-30 DIAGNOSIS — R519 Headache, unspecified: Secondary | ICD-10-CM | POA: Diagnosis not present

## 2024-06-18 DIAGNOSIS — M4722 Other spondylosis with radiculopathy, cervical region: Secondary | ICD-10-CM | POA: Diagnosis not present

## 2024-06-18 DIAGNOSIS — M542 Cervicalgia: Secondary | ICD-10-CM | POA: Diagnosis not present

## 2024-06-19 ENCOUNTER — Encounter: Payer: Self-pay | Admitting: Neurosurgery

## 2024-06-19 ENCOUNTER — Other Ambulatory Visit: Payer: Self-pay | Admitting: Neurosurgery

## 2024-06-19 DIAGNOSIS — M4722 Other spondylosis with radiculopathy, cervical region: Secondary | ICD-10-CM

## 2024-07-03 ENCOUNTER — Ambulatory Visit
Admission: RE | Admit: 2024-07-03 | Discharge: 2024-07-03 | Disposition: A | Discharge status: Home / Self Care | Source: Ambulatory Visit | Attending: Neurosurgery | Admitting: Neurosurgery

## 2024-07-03 DIAGNOSIS — M4722 Other spondylosis with radiculopathy, cervical region: Secondary | ICD-10-CM

## 2024-07-06 ENCOUNTER — Ambulatory Visit (HOSPITAL_COMMUNITY): Admission: EM | Admit: 2024-07-06 | Discharge: 2024-07-06 | Disposition: A | Discharge status: Home / Self Care

## 2024-07-06 ENCOUNTER — Encounter (HOSPITAL_COMMUNITY): Payer: Self-pay | Admitting: *Deleted

## 2024-07-06 DIAGNOSIS — S61511A Laceration without foreign body of right wrist, initial encounter: Secondary | ICD-10-CM | POA: Diagnosis not present

## 2024-07-06 DIAGNOSIS — Z23 Encounter for immunization: Secondary | ICD-10-CM | POA: Diagnosis not present

## 2024-07-06 DIAGNOSIS — W5501XA Bitten by cat, initial encounter: Secondary | ICD-10-CM | POA: Diagnosis not present

## 2024-07-06 MED ORDER — AMOXICILLIN-POT CLAVULANATE 875-125 MG PO TABS
1.0000 | ORAL_TABLET | Freq: Two times a day (BID) | ORAL | 0 refills | Status: AC
Start: 2024-07-06 — End: ?

## 2024-07-06 MED ORDER — MUPIROCIN 2 % EX OINT: 1.0000 | TOPICAL_OINTMENT | Freq: Two times a day (BID) | CUTANEOUS | 0 refills | Status: AC

## 2024-07-06 MED ORDER — ACETAMINOPHEN 325 MG PO TABS
ORAL_TABLET | ORAL | Status: AC
  Filled 2024-07-06: qty 2

## 2024-07-06 MED ORDER — RABIES IMMUNE GLOBULIN 1500 UNIT/10ML IJ SOLN
20.0000 [IU]/kg | Freq: Once | INTRAMUSCULAR | Status: AC
  Administered 2024-07-06: 1500 [IU]

## 2024-07-06 MED ORDER — ACETAMINOPHEN 325 MG PO TABS
650.0000 mg | ORAL_TABLET | Freq: Once | ORAL | Status: AC
  Administered 2024-07-06: 650 mg via ORAL

## 2024-07-06 MED ORDER — RABIES VACCINE, PCEC IM SUSR
INTRAMUSCULAR | Status: AC
  Filled 2024-07-06: qty 1

## 2024-07-06 MED ORDER — RABIES IMMUNE GLOBULIN 300 UNIT/2ML IJ SOLN
INTRAMUSCULAR | Status: AC
  Filled 2024-07-06: qty 2

## 2024-07-06 MED ORDER — TETANUS-DIPHTH-ACELL PERTUSSIS 5-2.5-18.5 LF-MCG/0.5 IM SUSY
PREFILLED_SYRINGE | INTRAMUSCULAR | Status: AC
  Filled 2024-07-06: qty 0.5

## 2024-07-06 MED ORDER — RABIES VACCINE, PCEC IM SUSR
1.0000 mL | Freq: Once | INTRAMUSCULAR | Status: AC
  Administered 2024-07-06: 1 mL via INTRAMUSCULAR

## 2024-07-06 MED ORDER — TETANUS-DIPHTH-ACELL PERTUSSIS 5-2.5-18.5 LF-MCG/0.5 IM SUSY
0.5000 mL | PREFILLED_SYRINGE | Freq: Once | INTRAMUSCULAR | Status: AC
  Administered 2024-07-06: 0.5 mL via INTRAMUSCULAR

## 2024-07-06 NOTE — Discharge Instructions (Signed)
 Start taking Augmentin  twice daily for 7 days for infection prevention. Apply mupirocin  ointment twice daily to your wounds for additional infection prevention. Keep the wounds clean dry and covered. Return here if you notice spreading of redness, swelling, or lots of purulent drainage from the wounds for reevaluation.  In addition to the human rabies immune globulin  (HRIG), you were given the first dose of the rabies vaccine  today (Day 0).  Please return here on the following dates to complete the rabies series: Day 3: 07/09/2024 Day 7: 07/13/2024 Day 14: 07/20/2024

## 2024-07-06 NOTE — ED Provider Notes (Signed)
 [facility]    CSN: [id number] Arrival date & time: 07/06/24  1345      History   Chief Complaint Chief Complaint  Patient presents with   Animal Bite    HPI Lori Mata is a 68 y.o. female.   Patient presents with cat bite and scratch to her right hand and wrist.  Patient states that this occurred earlier this morning when she was outside with her dog.  Patient states that the cat went after her dog and her dog attacked the cat.  Patient states that the cat died from its injuries and the animal control was going to be picking up the cat to evaluate for rabies.  Patient also reported that she has had a previous reaction to a tetanus injection in the past and was advised to not receive this again or have premedications prior to this.  Patient states that her reaction was significant arm soreness and difficulty moving her arm and that this occurred 25 years ago.  Patient denies rash, swelling, or difficulty breathing when she received a tetanus injection in the past.  The history is provided by the patient and medical records.  Animal Bite   Past Medical History:  Diagnosis Date   Arthritis    Hyperlipidemia    Swelling in right armpit     Patient Active Problem List   Diagnosis Date Noted   Carpal tunnel syndrome, right 04/14/2020   Carpal tunnel syndrome, left 04/14/2020   Trigger thumb of right hand 04/14/2020   Achilles tendon injury, left, subsequent encounter 09/26/2019    Past Surgical History:  Procedure Laterality Date   ABDOMINAL HYSTERECTOMY      OB History   No obstetric history on file.      Home Medications    Prior to Admission medications   Medication Sig Start Date End Date Taking? Authorizing Provider  amoxicillin -clavulanate (AUGMENTIN ) 875-125 MG tablet Take 1 tablet by mouth every 12 (twelve) hours. 07/06/24  Yes Johnie, Langston Tuberville A, NP  mupirocin  ointment (BACTROBAN ) 2 % Apply 1 Application topically 2 (two) times daily. 07/06/24   Yes Johnie Flaming A, NP  rosuvastatin (CRESTOR) 10 MG tablet 1 tablet Orally Once a day 07/05/21  Yes [provider]  spironolactone (ALDACTONE) 25 MG tablet  07/06/21  Yes [provider]    Family History History reviewed. No pertinent family history.  Social History Social History   Tobacco Use   Smoking status: Former    Current packs/day: 0.00    Types: Cigarettes    Quit date: 06/03/1979    Years since quitting: 45.1   Smokeless tobacco: Never  Vaping Use   Vaping status: Never Used  Substance Use Topics   Alcohol use: Yes   Drug use: No     Allergies   Tetanus toxoid and Prednisone   Review of Systems Review of Systems  Per HPI  Physical Exam Triage Vital Signs ED Triage Vitals  Encounter Vitals Group     BP 07/06/24 1435 124/80     Girls Systolic BP Percentile --      Girls Diastolic BP Percentile --      Boys Systolic BP Percentile --      Boys Diastolic BP Percentile --      Pulse Rate 07/06/24 1435 86     Resp 07/06/24 1435 16     Temp 07/06/24 1435 98.3 F (36.8 C)     Temp Source 07/06/24 1435 Oral  SpO2 07/06/24 1435 97 %     Weight 07/06/24 1437 157 lb 6 oz (71.4 kg)     Height --      Head Circumference --      Peak Flow --      Pain Score 07/06/24 1433 0     Pain Loc --      Pain Education --      Exclude from Growth Chart --    No data found.  Updated Vital Signs BP 124/80 (BP Location: Left Arm)   Pulse 86   Temp 98.3 F (36.8 C) (Oral)   Resp 16   Wt 157 lb 6 oz (71.4 kg)   SpO2 97%   BMI 24.65 kg/m   Visual Acuity Right Eye Distance:   Left Eye Distance:   Bilateral Distance:    Right Eye Near:   Left Eye Near:    Bilateral Near:     Physical Exam Vitals and nursing note reviewed.  Constitutional:      General: She is awake. She is not in acute distress.    Appearance: Normal appearance. She is well-developed and well-groomed. She is not ill-appearing.  Skin:    General: Skin is warm and  dry.     Findings: Laceration and wound present.     Comments: Approximately 2 cm superficial laceration noted to the posterior right wrist.  There is also a puncture wound noted next to this and on her right index finger.  Without signs of infection at this time.  Neurological:     Mental Status: She is alert.  Psychiatric:        Behavior: Behavior is cooperative.      UC Treatments / Results  Labs (all labs ordered are listed, but only abnormal results are displayed) Labs Reviewed - No data to display  EKG   Radiology No results found.  Procedures Procedures (including critical care time)  Medications Ordered in UC Medications  Rabies Immune Globulin  SOLN 1,500 Units (has no administration in time range)  rabies vaccine  (RABAVERT ) injection 1 mL (has no administration in time range)  acetaminophen  (TYLENOL ) tablet 650 mg (650 mg Oral Given 07/06/24 1505)  Tdap (BOOSTRIX ) injection 0.5 mL (0.5 mLs Intramuscular Given 07/06/24 1518)    Initial Impression / Assessment and Plan / UC Course  I have reviewed the triage vital signs and the nursing notes.  Pertinent labs & imaging results that were available during my care of the patient were reviewed by me and considered in my medical decision making (see chart for details).     Patient is overall well-appearing.  Vitals are stable.  Given Tylenol  as a premedication prior to receiving Tdap due to previous reported reaction.  Ordered rabies immunoglobulin and vaccine and discussed follow-up related to rabies vaccine .  Prescribed Augmentin  and mupirocin  for infection prevention.  Discussed proper wound care.  Provided basic wound care and dressing in clinic.  Discussed follow-up and return precautions. Final Clinical Impressions(s) / UC Diagnoses   Final diagnoses:  Cat bite, initial encounter  Laceration of right wrist, initial encounter     Discharge Instructions      Start taking Augmentin  twice daily for 7 days for  infection prevention. Apply mupirocin  ointment twice daily to your wounds for additional infection prevention. Keep the wounds clean dry and covered. Return here if you notice spreading of redness, swelling, or lots of purulent drainage from the wounds for reevaluation.  In addition to the human rabies immune globulin  (  HRIG), you were given the first dose of the rabies vaccine  today (Day 0).  Please return here on the following dates to complete the rabies series: Day 3: 07/09/2024 Day 7: 07/13/2024 Day 14: 07/20/2024    ED Prescriptions     Medication Sig Dispense Auth. Provider   amoxicillin -clavulanate (AUGMENTIN ) 875-125 MG tablet Take 1 tablet by mouth every 12 (twelve) hours. 14 tablet Johnie, Rosio Weiss A, NP   mupirocin  ointment (BACTROBAN ) 2 % Apply 1 Application topically 2 (two) times daily. 22 g Johnie Flaming A, NP      PDMP not reviewed this encounter.   Johnie Flaming A, NP 07/06/24 1535

## 2024-07-06 NOTE — ED Triage Notes (Addendum)
 Pt states she was bit by an stray cat on right lower arm this morning. She did take cat to vet and it died due to injuries from pts dog. Vet contacted animal control and they are picking up cat from vets office, form completed there.   Pt states previous reaction to Tetanus was advised to not get again or do premeds. Reaction was 25 years ago

## 2024-07-09 ENCOUNTER — Encounter (HOSPITAL_COMMUNITY): Payer: Self-pay

## 2024-07-09 ENCOUNTER — Ambulatory Visit (HOSPITAL_COMMUNITY)
Admission: EM | Admit: 2024-07-09 | Discharge: 2024-07-09 | Disposition: A | Discharge status: Home / Self Care | Attending: Family Medicine | Admitting: Family Medicine

## 2024-07-09 DIAGNOSIS — Z23 Encounter for immunization: Secondary | ICD-10-CM

## 2024-07-09 DIAGNOSIS — Z203 Contact with and (suspected) exposure to rabies: Secondary | ICD-10-CM | POA: Diagnosis not present

## 2024-07-09 MED ORDER — RABIES VACCINE, PCEC IM SUSR
1.0000 mL | Freq: Once | INTRAMUSCULAR | Status: AC
  Administered 2024-07-09: 1 mL via INTRAMUSCULAR

## 2024-07-09 MED ORDER — RABIES VACCINE, PCEC IM SUSR
INTRAMUSCULAR | Status: AC
Start: 2024-07-09 — End: 2024-07-09
  Filled 2024-07-09: qty 1

## 2024-07-09 NOTE — ED Triage Notes (Signed)
 Patient here today for Day 3 rabies vaccine . Patient is doing well.

## 2024-07-13 ENCOUNTER — Ambulatory Visit (HOSPITAL_COMMUNITY)
Admission: EM | Admit: 2024-07-13 | Discharge: 2024-07-13 | Disposition: A | Discharge status: Home / Self Care | Attending: Family Medicine | Admitting: Family Medicine

## 2024-07-13 DIAGNOSIS — Z203 Contact with and (suspected) exposure to rabies: Secondary | ICD-10-CM | POA: Diagnosis not present

## 2024-07-13 DIAGNOSIS — Z23 Encounter for immunization: Secondary | ICD-10-CM | POA: Diagnosis not present

## 2024-07-13 MED ORDER — RABIES VACCINE, PCEC IM SUSR
INTRAMUSCULAR | Status: AC
  Filled 2024-07-13: qty 1

## 2024-07-13 MED ORDER — RABIES VACCINE, PCEC IM SUSR
1.0000 mL | Freq: Once | INTRAMUSCULAR | Status: AC
  Administered 2024-07-13: 1 mL via INTRAMUSCULAR

## 2024-07-13 NOTE — ED Triage Notes (Signed)
 Pt presents for 3rd rabies vaccine . Denies any complications previously.

## 2024-07-15 DIAGNOSIS — M4802 Spinal stenosis, cervical region: Secondary | ICD-10-CM | POA: Diagnosis not present

## 2024-07-15 DIAGNOSIS — M542 Cervicalgia: Secondary | ICD-10-CM | POA: Diagnosis not present

## 2024-07-15 DIAGNOSIS — M4722 Other spondylosis with radiculopathy, cervical region: Secondary | ICD-10-CM | POA: Diagnosis not present

## 2024-07-15 DIAGNOSIS — M2578 Osteophyte, vertebrae: Secondary | ICD-10-CM | POA: Diagnosis not present

## 2024-07-15 DIAGNOSIS — M4723 Other spondylosis with radiculopathy, cervicothoracic region: Secondary | ICD-10-CM | POA: Diagnosis not present

## 2024-07-15 DIAGNOSIS — R519 Headache, unspecified: Secondary | ICD-10-CM | POA: Diagnosis not present

## 2024-07-17 DIAGNOSIS — M542 Cervicalgia: Secondary | ICD-10-CM | POA: Diagnosis not present

## 2024-07-17 DIAGNOSIS — M4722 Other spondylosis with radiculopathy, cervical region: Secondary | ICD-10-CM | POA: Diagnosis not present

## 2024-07-19 ENCOUNTER — Encounter (HOSPITAL_COMMUNITY): Payer: Self-pay

## 2024-07-19 ENCOUNTER — Ambulatory Visit (HOSPITAL_COMMUNITY)
Admission: RE | Admit: 2024-07-19 | Discharge: 2024-07-19 | Disposition: A | Discharge status: Home / Self Care | Source: Ambulatory Visit | Attending: Family Medicine | Admitting: Family Medicine

## 2024-07-19 DIAGNOSIS — Z203 Contact with and (suspected) exposure to rabies: Secondary | ICD-10-CM | POA: Diagnosis not present

## 2024-07-19 MED ORDER — RABIES VACCINE, PCEC IM SUSR
1.0000 mL | Freq: Once | INTRAMUSCULAR | Status: AC
  Administered 2024-07-19: 1 mL via INTRAMUSCULAR

## 2024-07-19 MED ORDER — RABIES VACCINE, PCEC IM SUSR
INTRAMUSCULAR | Status: AC
  Filled 2024-07-19: qty 1

## 2024-07-19 NOTE — ED Triage Notes (Signed)
 Patient here today for day 14 rabies vaccine  on day 13. Okay per Dr. Vonna. Patient is doing well.

## 2024-07-29 DIAGNOSIS — M542 Cervicalgia: Secondary | ICD-10-CM | POA: Diagnosis not present

## 2024-08-07 DIAGNOSIS — R519 Headache, unspecified: Secondary | ICD-10-CM | POA: Diagnosis not present

## 2024-08-12 DIAGNOSIS — M542 Cervicalgia: Secondary | ICD-10-CM | POA: Diagnosis not present

## 2024-08-23 DIAGNOSIS — M542 Cervicalgia: Secondary | ICD-10-CM | POA: Diagnosis not present

## 2024-08-30 DIAGNOSIS — M542 Cervicalgia: Secondary | ICD-10-CM | POA: Diagnosis not present

## 2024-09-02 ENCOUNTER — Other Ambulatory Visit: Payer: Self-pay | Admitting: Obstetrics and Gynecology

## 2024-09-02 DIAGNOSIS — Z1231 Encounter for screening mammogram for malignant neoplasm of breast: Secondary | ICD-10-CM

## 2024-09-10 ENCOUNTER — Ambulatory Visit: Admitting: Family Medicine

## 2024-09-10 VITALS — BP 108/80 | Ht 67.0 in | Wt 150.0 lb

## 2024-09-10 DIAGNOSIS — S93492A Sprain of other ligament of left ankle, initial encounter: Secondary | ICD-10-CM

## 2024-09-10 NOTE — Progress Notes (Unsigned)
 PCP: Clarice Nottingham, MD  Subjective:   HPI: Patient is a 68 y.o. female here for left ankle pain.  Patient reports 1 week ago she rolled her ankle while hiking in the woods over a root. She was walking with her dog and she unfortunately fell. She did not injure any other parts of her body. She was able to place weight on the ankle to a limited degree afterwards and make it back to her car. She iced the ankle and took over-the-counter Tylenol  that night and then continue to ice it over the past week. Her pain is greatly improved since then, she did note that she had extensive bruising on the lateral and frontal aspects of her foot initially as well as swelling. The bruising and swelling has significantly improved as well. She is here today because she would like an evaluation for when she can return to her activities which include walking at least 3 miles a day, hiking 8 to 12 miles, pilates.  Past Medical History:  Diagnosis Date   Arthritis    Hyperlipidemia    Swelling in right armpit     Current Outpatient Medications on File Prior to Visit  Medication Sig Dispense Refill   amoxicillin -clavulanate (AUGMENTIN ) 875-125 MG tablet Take 1 tablet by mouth every 12 (twelve) hours. 14 tablet 0   mupirocin  ointment (BACTROBAN ) 2 % Apply 1 Application topically 2 (two) times daily. 22 g 0   rosuvastatin (CRESTOR) 10 MG tablet 1 tablet Orally Once a day     spironolactone (ALDACTONE) 25 MG tablet      No current facility-administered medications on file prior to visit.    Past Surgical History:  Procedure Laterality Date   ABDOMINAL HYSTERECTOMY      Allergies  Allergen Reactions   Tetanus Toxoid Other (See Comments)    Soreness, inability to move arm    Prednisone Rash    Oral steroid    BP 108/80   Ht 5' 7 (1.702 m)   Wt 150 lb (68 kg)   BMI 23.49 kg/m      02/21/2022   10:55 AM  [facility]  Frequency of aerobic exercise (# of days/week) 7   Average time in minutes 20  Frequency of strengthening activities (# of days/week) 6        No data to display              Objective:  Physical Exam:  Gen: NAD, comfortable in exam room  Left ankle Inspection: Mild obvious swelling over the anterior portion of the lateral malleoli, mild bruising extending from the lateral malleoli down into the forefoot  Palpation: NTTP at navicular, posterior medial and lateral malleoli, base of the fifth metatarsal ROM: Full range of motion plantarflexion, dorsiflexion, eversion, inversion Special Tests: Anterior drawer laxity present, negative talar tilt Strength: 5/5 ankle inversion, eversion, plantarflexion, dorsiflexion Neurovascular: 2+ results pedis pulse Additional: Normal gait  Limited Ankle US : Fibularis brevis and longus visualized in short axis around the lateral malleoli extending towards the peroneal tubercle with some mild hypoechoic changes within the tendon sheath.  Similar hypoechoic changes visualized in long axis, no obvious tears or intratendinous hyper or hypoechoic changes.  Distal lateral fibula and malleoli visualized without obvious cortical irregularity concerning for acute fracture though mild spurring present.  Tibiotalar joint visualized and short axis without ankle effusion.  ATFL visualized intact and long axis, hypoechoic changes superficial to the ATFL present.  Impression: Consistent with mild ATFL sprain,  mild strain of peroneal tendons, mild degenerative changes of the distal fibula  Assessment & Plan:   Assessment & Plan Sprain of anterior talofibular ligament of left ankle, initial encounter Clinically consistent with well-healing mild ATFL sprain, reassuringly able to visualize ATFL on ultrasound imaging making complete tear unlikely.  Patient does not meet [place] criteria, no indications for radiographs of the ankle and foot and no clinical concern for fracture. - Wear lace up ankle brace for the next 4  weeks - Follow-up 3 to 4 weeks as needed if not improving - Ice 3-4 times a day for 15 minutes - Activity as tolerated from pain standpoint - Discussed slowly gradually increasing activity by starting with walking up to a mile to start - Recommend waiting at least a week before resuming light weight Pilates - Provided with home exercise regimen for ankle  Ozell Provencal, MD, PGY-3 [facility] 2:17 PM 09/10/2024

## 2024-09-10 NOTE — Patient Instructions (Signed)
 You have an ankle sprain. Ice the area for 15 minutes at a time, 3-4 times a day Aleve with food for pain and inflammation as needed. Elevate above the level of your heart when possible Use ankle brace when walking for exercise (or pilates) for 4 weeks. Slowly increase your walking distance as we discussed. Start theraband strengthening exercises when directed - once a day 3 sets of 10. Consider physical therapy for strengthening and balance exercises. Follow up in 3-4 weeks (or as needed if you're doing well).

## 2024-09-12 DIAGNOSIS — Z6825 Body mass index (BMI) 25.0-25.9, adult: Secondary | ICD-10-CM | POA: Diagnosis not present

## 2024-09-12 DIAGNOSIS — N958 Other specified menopausal and perimenopausal disorders: Secondary | ICD-10-CM | POA: Diagnosis not present

## 2024-09-12 DIAGNOSIS — M8588 Other specified disorders of bone density and structure, other site: Secondary | ICD-10-CM | POA: Diagnosis not present

## 2024-09-12 DIAGNOSIS — Z124 Encounter for screening for malignant neoplasm of cervix: Secondary | ICD-10-CM | POA: Diagnosis not present

## 2024-09-12 DIAGNOSIS — Z1272 Encounter for screening for malignant neoplasm of vagina: Secondary | ICD-10-CM | POA: Diagnosis not present

## 2024-09-12 DIAGNOSIS — Z1151 Encounter for screening for human papillomavirus (HPV): Secondary | ICD-10-CM | POA: Diagnosis not present

## 2024-10-21 ENCOUNTER — Ambulatory Visit
Admission: RE | Admit: 2024-10-21 | Discharge: 2024-10-21 | Disposition: A | Discharge status: Home / Self Care | Source: Ambulatory Visit | Attending: Obstetrics and Gynecology | Admitting: Obstetrics and Gynecology

## 2024-10-21 DIAGNOSIS — Z1231 Encounter for screening mammogram for malignant neoplasm of breast: Secondary | ICD-10-CM

## 2024-11-21 DIAGNOSIS — I788 Other diseases of capillaries: Secondary | ICD-10-CM | POA: Diagnosis not present

## 2024-11-21 DIAGNOSIS — L821 Other seborrheic keratosis: Secondary | ICD-10-CM | POA: Diagnosis not present

## 2024-11-21 DIAGNOSIS — L820 Inflamed seborrheic keratosis: Secondary | ICD-10-CM | POA: Diagnosis not present

## 2024-11-21 DIAGNOSIS — D1801 Hemangioma of skin and subcutaneous tissue: Secondary | ICD-10-CM | POA: Diagnosis not present

## 2024-11-21 DIAGNOSIS — L814 Other melanin hyperpigmentation: Secondary | ICD-10-CM | POA: Diagnosis not present

## 2025-03-10 ENCOUNTER — Ambulatory Visit: Admitting: Neurology
